# Patient Record
Sex: Female | Born: 1985 | Race: White | Hispanic: No | Marital: Married | Smoking: Never smoker
Health system: Southern US, Community
[De-identification: ages and names within clinical notes are randomized; demographics above are authoritative.]

## PROBLEM LIST (undated history)

## (undated) DIAGNOSIS — R87612 Low grade squamous intraepithelial lesion on cytologic smear of cervix (LGSIL): Secondary | ICD-10-CM

## (undated) DIAGNOSIS — Z803 Family history of malignant neoplasm of breast: Secondary | ICD-10-CM

## (undated) DIAGNOSIS — Z800 Family history of malignant neoplasm of digestive organs: Secondary | ICD-10-CM

## (undated) DIAGNOSIS — Z8742 Personal history of other diseases of the female genital tract: Secondary | ICD-10-CM

## (undated) DIAGNOSIS — Z9189 Other specified personal risk factors, not elsewhere classified: Secondary | ICD-10-CM

## (undated) DIAGNOSIS — Z8041 Family history of malignant neoplasm of ovary: Secondary | ICD-10-CM

## (undated) DIAGNOSIS — O4290 Premature rupture of membranes, unspecified as to length of time between rupture and onset of labor, unspecified weeks of gestation: Secondary | ICD-10-CM

## (undated) DIAGNOSIS — R8271 Bacteriuria: Secondary | ICD-10-CM

## (undated) DIAGNOSIS — Z1371 Encounter for nonprocreative screening for genetic disease carrier status: Secondary | ICD-10-CM

## (undated) HISTORY — DX: Family history of malignant neoplasm of breast: Z80.3

## (undated) HISTORY — DX: Personal history of other diseases of the female genital tract: Z87.42

## (undated) HISTORY — DX: Family history of malignant neoplasm of ovary: Z80.41

## (undated) HISTORY — DX: Family history of malignant neoplasm of digestive organs: Z80.0

## (undated) HISTORY — DX: Low grade squamous intraepithelial lesion on cytologic smear of cervix (LGSIL): R87.612

## (undated) HISTORY — DX: Other specified personal risk factors, not elsewhere classified: Z91.89

## (undated) HISTORY — DX: Encounter for nonprocreative screening for genetic disease carrier status: Z13.71

## (undated) HISTORY — PX: LEEP: SHX91

---
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
## 2017-08-11 ENCOUNTER — Emergency Department: Payer: No Typology Code available for payment source

## 2017-08-11 ENCOUNTER — Encounter: Payer: Self-pay | Admitting: Emergency Medicine

## 2017-08-11 ENCOUNTER — Emergency Department
Admission: EM | Admit: 2017-08-11 | Discharge: 2017-08-12 | Disposition: A | Discharge status: Home / Self Care | Payer: No Typology Code available for payment source | Attending: Emergency Medicine | Admitting: Emergency Medicine

## 2017-08-11 DIAGNOSIS — O26891 Other specified pregnancy related conditions, first trimester: Secondary | ICD-10-CM | POA: Insufficient documentation

## 2017-08-11 DIAGNOSIS — Z3A01 Less than 8 weeks gestation of pregnancy: Secondary | ICD-10-CM | POA: Diagnosis not present

## 2017-08-11 DIAGNOSIS — R300 Dysuria: Secondary | ICD-10-CM | POA: Diagnosis not present

## 2017-08-11 DIAGNOSIS — N898 Other specified noninflammatory disorders of vagina: Secondary | ICD-10-CM | POA: Insufficient documentation

## 2017-08-11 DIAGNOSIS — Z3491 Encounter for supervision of normal pregnancy, unspecified, first trimester: Secondary | ICD-10-CM

## 2017-08-11 LAB — URINALYSIS, COMPLETE (UACMP) WITH MICROSCOPIC
BILIRUBIN URINE: NEGATIVE
GLUCOSE, UA: NEGATIVE mg/dL
Ketones, ur: NEGATIVE mg/dL
Leukocytes, UA: NEGATIVE
NITRITE: NEGATIVE
PH: 6 (ref 5.0–8.0)
Protein, ur: NEGATIVE mg/dL
SPECIFIC GRAVITY, URINE: 1.008 (ref 1.005–1.030)

## 2017-08-11 LAB — COMPREHENSIVE METABOLIC PANEL
ALBUMIN: 4 g/dL (ref 3.5–5.0)
ALT: 21 U/L (ref 14–54)
ANION GAP: 8 (ref 5–15)
AST: 25 U/L (ref 15–41)
Alkaline Phosphatase: 74 U/L (ref 38–126)
BILIRUBIN TOTAL: 0.4 mg/dL (ref 0.3–1.2)
BUN: 11 mg/dL (ref 6–20)
CHLORIDE: 106 mmol/L (ref 101–111)
CO2: 24 mmol/L (ref 22–32)
Calcium: 9.2 mg/dL (ref 8.9–10.3)
Creatinine, Ser: 0.63 mg/dL (ref 0.44–1.00)
GFR calc Af Amer: >60 mL/min (ref >60)
GFR calc non Af Amer: >60 mL/min (ref >60)
Glucose, Bld: 96 mg/dL (ref 65–99)
POTASSIUM: 3.6 mmol/L (ref 3.5–5.1)
SODIUM: 138 mmol/L (ref 135–145)
TOTAL PROTEIN: 7.4 g/dL (ref 6.5–8.1)

## 2017-08-11 LAB — WET PREP, GENITAL
CLUE CELLS WET PREP: NONE SEEN
SPERM: NONE SEEN
Trich, Wet Prep: NONE SEEN
Yeast Wet Prep HPF POC: NONE SEEN

## 2017-08-11 LAB — CBC
HCT: 37.9 % (ref 35.0–47.0)
HEMOGLOBIN: 13 g/dL (ref 12.0–16.0)
MCH: 29.4 pg (ref 26.0–34.0)
MCHC: 34.4 g/dL (ref 32.0–36.0)
MCV: 85.5 fL (ref 80.0–100.0)
PLATELETS: 270 10*3/uL (ref 150–440)
RBC: 4.43 MIL/uL (ref 3.80–5.20)
RDW: 13.9 % (ref 11.5–14.5)
WBC: 9.7 10*3/uL (ref 3.6–11.0)

## 2017-08-11 LAB — HCG, QUANTITATIVE, PREGNANCY: HCG, BETA CHAIN, QUANT, S: 1890 m[IU]/mL — AB (ref <5)

## 2017-08-11 NOTE — ED Provider Notes (Signed)
[facility] Emergency Department Provider Note   First MD Initiated Contact with Patient 08/11/17 2315     (approximate)  I have reviewed the triage vital signs and the nursing notes.   HISTORY  Chief Complaint Abdominal Pain    HPI Rachel Montes is a 31 y.o. female G2 para 1 approximately 2-[redacted] weeks pregnant presents to the emergency department with abdominal discomfort dysuria. Patient denies any vaginal bleeding. Patient denies any hematuria no fever no back pain. Patient denies any nausea or vomiting. Patient denies any vaginal discharge.     Past Medical History  One previous vaginal delivery without consultation There are no active problems to display for this patient.   Past surgical history none Prior to Admission medications   Not on File    Allergies No known drug allergies No family history on file.  Social History Social History  Substance Use Topics  . Smoking status: Never Smoker  . Smokeless tobacco: Never Used  . Alcohol use Not on file    Review of Systems Constitutional: No fever/chills Eyes: No visual changes. ENT: No sore throat. Cardiovascular: Denies chest pain. Respiratory: Denies shortness of breath. Gastrointestinal: No abdominal pain.  No nausea, no vomiting.  No diarrhea.  No constipation. Genitourinary: Negative for dysuria. Musculoskeletal: Negative for neck pain.  Negative for back pain. Integumentary: Negative for rash. Neurological: Negative for headaches, focal weakness or numbness.  ____________________________________________   PHYSICAL EXAM:  VITAL SIGNS: ED Triage Vitals [08/11/17 2055]  Enc Vitals Group     BP 130/67     Pulse Rate 71     Resp 18     Temp 98.9 F (37.2 C)     Temp Source Oral     SpO2 99 %     Weight 65.8 kg (145 lb)     Height 1.727 m ( )     Head Circumference      Peak Flow      Pain Score 8     Pain Loc      Pain Edu?      Excl. in GC?      Constitutional: Alert and oriented. Well appearing and in no acute distress. Eyes: Conjunctivae are normal.  Head: Atraumatic. Mouth/Throat: Mucous membranes are moist.  Oropharynx non-erythematous. Neck: No stridor.  Cardiovascular: Normal rate, regular rhythm. Good peripheral circulation. Grossly normal heart sounds. Respiratory: Normal respiratory effort.  No retractions. Lungs CTAB. Gastrointestinal: Soft and nontender. No distention.  Genitourinary: scant white vaginal discharge. Musculoskeletal: No lower extremity tenderness nor edema. No gross deformities of extremities. Neurologic:  Normal speech and language. No gross focal neurologic deficits are appreciated.  Skin:  Skin is warm, dry and intact. No rash noted. Psychiatric: Mood and affect are normal. Speech and behavior are normal.  ____________________________________________   LABS (all labs ordered are listed, but only abnormal results are displayed)  Labs Reviewed  WET PREP, GENITAL - Abnormal; Notable for the following:       Result Value   WBC, Wet Prep HPF POC FEW (*)    All other components within normal limits  URINALYSIS, COMPLETE (UACMP) WITH MICROSCOPIC - Abnormal; Notable for the following:    Color, Urine STRAW (*)    APPearance CLEAR (*)    Hgb urine dipstick SMALL (*)    Bacteria, UA RARE (*)    Squamous Epithelial / LPF 0-5 (*)    All other components within normal limits  HCG, QUANTITATIVE, PREGNANCY - Abnormal; Notable for  the following:    hCG, Beta Chain, Quant, S 1,890 (*)    All other components within normal limits  CHLAMYDIA/NGC RT PCR ([facility] ONLY)  COMPREHENSIVE METABOLIC PANEL  CBC    RADIOLOGY I, [place] Manuelita Moxon, personally viewed and evaluated these images (plain radiographs) as part of my medical decision making, as well as reviewing the written report by the radiologist.  [place] Ob Comp Less 14 Wks  Result Date: 08/12/2017 CLINICAL DATA:  Initial evaluation for acute  pelvic pain for 2 days. Beta HCG equals 1,890. EXAM: OBSTETRIC <14 WK ULTRASOUND TECHNIQUE: Transabdominal ultrasound was performed for evaluation of the gestation as well as the maternal uterus and adnexal regions. COMPARISON:  None. FINDINGS: Intrauterine gestational sac: None visualized. Yolk sac:  None visualized. Embryo:  None visualized. Cardiac Activity: N/A Heart Rate: N/A bpm Subchorionic hemorrhage:  None visualized. Maternal uterus/adnexae: Right ovary visualized within the right adnexa and is normal in appearance. Left ovary was not visualized on this exam due to overlying bowel gas. No adnexal mass. No free fluid within the pelvis. IMPRESSION: 1. Early pregnancy with no IUP identified. This is consistent with a pregnancy of unknown location at this time. Differential considerations include pregnancy too early to visualize, recent SAB, or possibly occult ectopic pregnancy. Close clinical monitoring with serial beta hCGs and close interval follow-up ultrasound recommended as clinically warranted. 2. No other acute maternal uterine or adnexal abnormality. Electronically Signed   By: Rise MuBenjamin  McClintock M.D.   On: 08/12/2017 00:20   [place] Ob Transvaginal  Result Date: 08/12/2017 CLINICAL DATA:  Initial evaluation for acute pelvic pain for 2 days. Beta HCG equals 1,890. EXAM: OBSTETRIC <14 WK ULTRASOUND TECHNIQUE: Transabdominal ultrasound was performed for evaluation of the gestation as well as the maternal uterus and adnexal regions. COMPARISON:  None. FINDINGS: Intrauterine gestational sac: None visualized. Yolk sac:  None visualized. Embryo:  None visualized. Cardiac Activity: N/A Heart Rate: N/A bpm Subchorionic hemorrhage:  None visualized. Maternal uterus/adnexae: Right ovary visualized within the right adnexa and is normal in appearance. Left ovary was not visualized on this exam due to overlying bowel gas. No adnexal mass. No free fluid within the pelvis. IMPRESSION: 1. Early pregnancy with no IUP  identified. This is consistent with a pregnancy of unknown location at this time. Differential considerations include pregnancy too early to visualize, recent SAB, or possibly occult ectopic pregnancy. Close clinical monitoring with serial beta hCGs and close interval follow-up ultrasound recommended as clinically warranted. 2. No other acute maternal uterine or adnexal abnormality. Electronically Signed   By: Rise MuBenjamin  McClintock M.D.   On: 08/12/2017 00:20     Procedures   ____________________________________________   INITIAL IMPRESSION / ASSESSMENT AND PLAN / ED COURSE  Pertinent labs & imaging results that were available during my care of the patient were reviewed by me and considered in my medical decision making (see chart for details).  31 year old female presenting with above stated history. No clear etiology for the patient's dysuria noted considered possibly of a chemical urethritis. Regarding patient's pregnancy state hCG Quant 1890 with no visualized IUP noted most likely secondary to early gestation. However ectopic pregnancy cannot be excluded at this time. Patient is advised to follow-up with OB/GYN in 48 hours. Patient advised return to emergency department if any abdominal pain and vomiting vaginal bleeding or any other emergency medical concerns.      ____________________________________________  FINAL CLINICAL IMPRESSION(S) / ED DIAGNOSES  Final diagnoses:  First trimester pregnancy  Dysuria  MEDICATIONS GIVEN DURING THIS VISIT:  Medications - No data to display   NEW OUTPATIENT MEDICATIONS STARTED DURING THIS VISIT:  There are no discharge medications for this patient.   There are no discharge medications for this patient.   There are no discharge medications for this patient.    Note:  This document was prepared using Dragon voice recognition software and may include unintentional dictation errors.    Darci Current, MD 08/12/17 [phone]

## 2017-08-12 LAB — CHLAMYDIA/NGC RT PCR (ARMC ONLY)
CHLAMYDIA TR: NOT DETECTED
N GONORRHOEAE: NOT DETECTED

## 2017-09-05 DIAGNOSIS — R87612 Low grade squamous intraepithelial lesion on cytologic smear of cervix (LGSIL): Secondary | ICD-10-CM

## 2017-09-05 HISTORY — DX: Low grade squamous intraepithelial lesion on cytologic smear of cervix (LGSIL): R87.612

## 2017-09-14 ENCOUNTER — Encounter: Payer: Self-pay | Admitting: Maternal Newborn

## 2017-09-21 ENCOUNTER — Ambulatory Visit (INDEPENDENT_AMBULATORY_CARE_PROVIDER_SITE_OTHER): Payer: No Typology Code available for payment source | Admitting: Obstetrics and Gynecology

## 2017-09-21 ENCOUNTER — Encounter: Payer: Self-pay | Admitting: Obstetrics and Gynecology

## 2017-09-21 VITALS — BP 100/62 | Wt 155.0 lb

## 2017-09-21 DIAGNOSIS — Z3480 Encounter for supervision of other normal pregnancy, unspecified trimester: Secondary | ICD-10-CM

## 2017-09-21 DIAGNOSIS — Z1379 Encounter for other screening for genetic and chromosomal anomalies: Secondary | ICD-10-CM

## 2017-09-21 DIAGNOSIS — Z3A10 10 weeks gestation of pregnancy: Secondary | ICD-10-CM

## 2017-09-21 NOTE — Patient Instructions (Signed)
First Trimester of Pregnancy The first trimester of pregnancy is from week 1 until the end of week 13 (months 1 through 3). A week after a sperm fertilizes an egg, the egg will implant on the wall of the uterus. This embryo will begin to develop into a baby. Genes from you and your partner will form the baby. The female genes will determine whether the baby will be a boy or a girl. At 6-8 weeks, the eyes and face will be formed, and the heartbeat can be seen on ultrasound. At the end of 12 weeks, all the baby's organs will be formed. Now that you are pregnant, you will want to do everything you can to have a healthy baby. Two of the most important things are to get good prenatal care and to follow your health care provider's instructions. Prenatal care is all the medical care you receive before the baby's birth. This care will help prevent, find, and treat any problems during the pregnancy and childbirth. Body changes during your first trimester Your body goes through many changes during pregnancy. The changes vary from woman to woman.  You may gain or lose a couple of pounds at first.  You may feel sick to your stomach (nauseous) and you may throw up (vomit). If the vomiting is uncontrollable, call your health care provider.  You may tire easily.  You may develop headaches that can be relieved by medicines. All medicines should be approved by your health care provider.  You may urinate more often. Painful urination may mean you have a bladder infection.  You may develop heartburn as a result of your pregnancy.  You may develop constipation because certain hormones are causing the muscles that push stool through your intestines to slow down.  You may develop hemorrhoids or swollen veins (varicose veins).  Your breasts may begin to grow larger and become tender. Your nipples may stick out more, and the tissue that surrounds them (areola) may become darker.  Your gums may bleed and may be  sensitive to brushing and flossing.  Dark spots or blotches (chloasma, mask of pregnancy) may develop on your face. This will likely fade after the baby is born.  Your menstrual periods will stop.  You may have a loss of appetite.  You may develop cravings for certain kinds of food.  You may have changes in your emotions from day to day, such as being excited to be pregnant or being concerned that something may go wrong with the pregnancy and baby.  You may have more vivid and strange dreams.  You may have changes in your hair. These can include thickening of your hair, rapid growth, and changes in texture. Some women also have hair loss during or after pregnancy, or hair that feels dry or thin. Your hair will most likely return to normal after your baby is born.  What to expect at prenatal visits During a routine prenatal visit:  You will be weighed to make sure you and the baby are growing normally.  Your blood pressure will be taken.  Your abdomen will be measured to track your baby's growth.  The fetal heartbeat will be listened to between weeks 10 and 14 of your pregnancy.  Test results from any previous visits will be discussed.  Your health care provider may ask you:  How you are feeling.  If you are feeling the baby move.  If you have had any abnormal symptoms, such as leaking fluid, bleeding, severe headaches,   or abdominal cramping.  If you are using any tobacco products, including cigarettes, chewing tobacco, and electronic cigarettes.  If you have any questions.  Other tests that may be performed during your first trimester include:  Blood tests to find your blood type and to check for the presence of any previous infections. The tests will also be used to check for low iron levels (anemia) and protein on red blood cells (Rh antibodies). Depending on your risk factors, or if you previously had diabetes during pregnancy, you may have tests to check for high blood  sugar that affects pregnant women (gestational diabetes).  Urine tests to check for infections, diabetes, or protein in the urine.  An ultrasound to confirm the proper growth and development of the baby.  Fetal screens for spinal cord problems (spina bifida) and Down syndrome.  HIV (human immunodeficiency virus) testing. Routine prenatal testing includes screening for HIV, unless you choose not to have this test.  You may need other tests to make sure you and the baby are doing well.  Follow these instructions at home: Medicines  Follow your health care provider's instructions regarding medicine use. Specific medicines may be either safe or unsafe to take during pregnancy.  Take a prenatal vitamin that contains at least 600 micrograms (mcg) of folic acid.  If you develop constipation, try taking a stool softener if your health care provider approves. Eating and drinking  Eat a balanced diet that includes fresh fruits and vegetables, whole grains, good sources of protein such as meat, eggs, or tofu, and low-fat dairy. Your health care provider will help you determine the amount of weight gain that is right for you.  Avoid raw meat and uncooked cheese. These carry germs that can cause birth defects in the baby.  Eating four or five small meals rather than three large meals a day may help relieve nausea and vomiting. If you start to feel nauseous, eating a few soda crackers can be helpful. Drinking liquids between meals, instead of during meals, also seems to help ease nausea and vomiting.  Limit foods that are high in fat and processed sugars, such as fried and sweet foods.  To prevent constipation: ? Eat foods that are high in fiber, such as fresh fruits and vegetables, whole grains, and beans. ? Drink enough fluid to keep your urine clear or pale yellow. Activity  Exercise only as directed by your health care provider. Most women can continue their usual exercise routine during  pregnancy. Try to exercise for 30 minutes at least 5 days a week. Exercising will help you: ? Control your weight. ? Stay in shape. ? Be prepared for labor and delivery.  Experiencing pain or cramping in the lower abdomen or lower back is a good sign that you should stop exercising. Check with your health care provider before continuing with normal exercises.  Try to avoid standing for long periods of time. Move your legs often if you must stand in one place for a long time.  Avoid heavy lifting.  Wear low-heeled shoes and practice good posture.  You may continue to have sex unless your health care provider tells you not to. Relieving pain and discomfort  Wear a good support bra to relieve breast tenderness.  Take warm sitz baths to soothe any pain or discomfort caused by hemorrhoids. Use hemorrhoid cream if your health care provider approves.  Rest with your legs elevated if you have leg cramps or low back pain.  If you develop   varicose veins in your legs, wear support hose. Elevate your feet for 15 minutes, 3-4 times a day. Limit salt in your diet. Prenatal care  Schedule your prenatal visits by the twelfth week of pregnancy. They are usually scheduled monthly at first, then more often in the last 2 months before delivery.  Write down your questions. Take them to your prenatal visits.  Keep all your prenatal visits as told by your health care provider. This is important. Safety  Wear your seat belt at all times when driving.  Make a list of emergency phone numbers, including numbers for family, friends, the hospital, and police and fire departments. General instructions  Ask your health care provider for a referral to a local prenatal education class. Begin classes no later than the beginning of month 6 of your pregnancy.  Ask for help if you have counseling or nutritional needs during pregnancy. Your health care provider can offer advice or refer you to specialists for help  with various needs.  Do not use hot tubs, steam rooms, or saunas.  Do not douche or use tampons or scented sanitary pads.  Do not cross your legs for long periods of time.  Avoid cat litter boxes and soil used by cats. These carry germs that can cause birth defects in the baby and possibly loss of the fetus by miscarriage or stillbirth.  Avoid all smoking, herbs, alcohol, and medicines not prescribed by your health care provider. Chemicals in these products affect the formation and growth of the baby.  Do not use any products that contain nicotine or tobacco, such as cigarettes and e-cigarettes. If you need help quitting, ask your health care provider. You may receive counseling support and other resources to help you quit.  Schedule a dentist appointment. At home, brush your teeth with a soft toothbrush and be gentle when you floss. Contact a health care provider if:  You have dizziness.  You have mild pelvic cramps, pelvic pressure, or nagging pain in the abdominal area.  You have persistent nausea, vomiting, or diarrhea.  You have a bad smelling vaginal discharge.  You have pain when you urinate.  You notice increased swelling in your face, hands, legs, or ankles.  You are exposed to fifth disease or chickenpox.  You are exposed to German measles (rubella) and have never had it. Get help right away if:  You have a fever.  You are leaking fluid from your vagina.  You have spotting or bleeding from your vagina.  You have severe abdominal cramping or pain.  You have rapid weight gain or loss.  You vomit blood or material that looks like coffee grounds.  You develop a severe headache.  You have shortness of breath.  You have any kind of trauma, such as from a fall or a car accident. Summary  The first trimester of pregnancy is from week 1 until the end of week 13 (months 1 through 3).  Your body goes through many changes during pregnancy. The changes vary from  woman to woman.  You will have routine prenatal visits. During those visits, your health care provider will examine you, discuss any test results you may have, and talk with you about how you are feeling. This information is not intended to replace advice given to you by your health care provider. Make sure you discuss any questions you have with your health care provider. Document Released: 11/16/2001 Document Revised: 11/03/2016 Document Reviewed: 11/03/2016 Elsevier Interactive Patient Education  2017 Elsevier   Inc.  
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
## 2017-09-23 LAB — RPR+RH+ABO+RUB AB+AB SCR+CB...
Antibody Screen: NEGATIVE
HEMATOCRIT: 34.7 % (ref 34.0–46.6)
HEMOGLOBIN: 11.8 g/dL (ref 11.1–15.9)
HEP B S AG: NEGATIVE
HIV SCREEN 4TH GENERATION: NONREACTIVE
MCH: 29.3 pg (ref 26.6–33.0)
MCHC: 34 g/dL (ref 31.5–35.7)
MCV: 86 fL (ref 79–97)
Platelets: 289 10*3/uL (ref 150–379)
RBC: 4.03 x10E6/uL (ref 3.77–5.28)
RDW: 14.1 % (ref 12.3–15.4)
RH TYPE: NEGATIVE
RPR: NONREACTIVE
Rubella Antibodies, IGG: 0.94 index — ABNORMAL LOW (ref >0.99)
Varicella zoster IgG: 344 index (ref >165)
WBC: 9.2 10*3/uL (ref 3.4–10.8)

## 2017-09-23 LAB — URINE CULTURE

## 2017-09-24 LAB — PAPIG, HPV, RFX 16/18
HPV, high-risk: POSITIVE — AB
PAP SMEAR COMMENT: 0

## 2017-09-27 ENCOUNTER — Encounter: Payer: Self-pay | Admitting: Obstetrics and Gynecology

## 2017-09-28 ENCOUNTER — Telehealth: Payer: Self-pay | Admitting: Obstetrics and Gynecology

## 2017-09-28 NOTE — Telephone Encounter (Signed)
Pt was seen 09/1717 with [facility]> pt was to return in a week for an NT scan and ob appt. Unable to leave voicemail for pt to call back to be schedule

## 2017-09-28 NOTE — Telephone Encounter (Signed)
Pt is  possibility 12w 5 days 09/28/17.

## 2017-10-01 ENCOUNTER — Other Ambulatory Visit: Payer: Self-pay | Admitting: Obstetrics and Gynecology

## 2017-10-01 ENCOUNTER — Encounter: Payer: Self-pay | Admitting: Obstetrics and Gynecology

## 2017-10-01 DIAGNOSIS — Z3400 Encounter for supervision of normal first pregnancy, unspecified trimester: Secondary | ICD-10-CM | POA: Insufficient documentation

## 2017-10-01 DIAGNOSIS — R87612 Low grade squamous intraepithelial lesion on cytologic smear of cervix (LGSIL): Secondary | ICD-10-CM | POA: Insufficient documentation

## 2017-10-01 DIAGNOSIS — R8271 Bacteriuria: Secondary | ICD-10-CM

## 2017-10-01 HISTORY — DX: Bacteriuria: R82.71

## 2017-10-01 MED ORDER — CEPHALEXIN 500 MG PO CAPS: 500.0000 mg | ORAL_CAPSULE | Freq: Three times a day (TID) | ORAL | 0 refills | Status: AC

## 2017-10-07 NOTE — Telephone Encounter (Signed)
Called and lvm voicemail for pt to call back to be schedule.

## 2017-10-10 ENCOUNTER — Other Ambulatory Visit: Payer: Self-pay | Admitting: Obstetrics and Gynecology

## 2017-10-10 MED ORDER — TERCONAZOLE 0.4 % VA CREA: 1.0000 | TOPICAL_CREAM | Freq: Every day | VAGINAL | 1 refills | Status: DC

## 2017-10-25 ENCOUNTER — Encounter: Payer: Self-pay | Admitting: Obstetrics and Gynecology

## 2017-11-09 ENCOUNTER — Encounter: Payer: Self-pay | Admitting: Advanced Practice Midwife

## 2017-11-09 ENCOUNTER — Ambulatory Visit (INDEPENDENT_AMBULATORY_CARE_PROVIDER_SITE_OTHER): Payer: No Typology Code available for payment source | Admitting: Advanced Practice Midwife

## 2017-11-09 VITALS — BP 102/62 | Wt 157.0 lb

## 2017-11-09 DIAGNOSIS — Z3A17 17 weeks gestation of pregnancy: Secondary | ICD-10-CM

## 2017-11-09 NOTE — Progress Notes (Signed)
Routine Prenatal Care Visit  Subjective  Rachel Montes is a 31 y.o. G3P1011 at 482w4d being seen today for ongoing prenatal care.  She is currently monitored for the following issues for this low-risk pregnancy and has Supervision of normal first pregnancy, antepartum; Low grade squamous intraepith lesion on cytologic smear cervix (lgsil); and GBS bacteriuria on their problem list.  ----------------------------------------------------------------------------------- Patient reports no complaints.  Patient and her husband are under the impression that today they will find out the gender of the baby per conversation they had with Dr Bonney AidStaebler. Informed patient that Dr Bonney AidStaebler may have been talking about bedside u/s, but he is not in office today and I am not qualified to provide that service and otherwise that is usually done at 20 week anatomy scan. Patient and husband seem disappointed and that they are in a hurry to find out the gender. I have told them that at 18 weeks we can do the anatomy scan and since she will be 4270w6d on Friday that they could have it then if scheduling allows. Discussion of the need for colposcopy that she will need to schedule. Also brief discussion of husband's latent TB and the possibility of interferon gold blood test.  Denies contractions. Denies vaginal bleeding. Denies leaking of fluid.  ----------------------------------------------------------------------------------- The following portions of the patient's history were reviewed and updated as appropriate: allergies, current medications, past family history, past medical history, past social history, past surgical history and problem list. Problem list updated.   Objective  Blood pressure 102/62, weight 157 lb (71.2 kg), last menstrual period 07/01/2017. Pregravid weight 140 lb (63.5 kg) Total Weight Gain 17 lb (7.711 kg) Urinalysis: Urine Protein: Negative Urine Glucose: Negative  Fetal Status: positive fetal  movement  General:  Alert, oriented and cooperative. Patient is in no acute distress.  Skin: Skin is warm and dry. No rash noted.   Cardiovascular: Normal heart rate noted  Respiratory: Normal respiratory effort, no problems with respiration noted  Abdomen: Soft, gravid, appropriate for gestational age.       Pelvic:  Cervical exam deferred        Extremities: Normal range of motion.     Mental Status: Normal mood and affect. Normal behavior. Normal judgment and thought content.   Assessment   31 y.o. G3P1011 at 552w4d by  04/15/2018, by Ultrasound presenting for routine prenatal visit  Plan   Pregnancy#2 Problems (from 07/01/17 to present)    Problem Noted Resolved   Supervision of normal first pregnancy, antepartum 10/01/2017 by Vena AustriaStaebler, Andreas, MD No   Overview Signed 10/01/2017  9:29 AM by Vena AustriaStaebler, Andreas, MD    Clinic [facility] Prenatal Labs  [facility] Blood type: O negative  Genetic Screen 1 Screen:    AFP:     Quad:     NIPS: Antibody: negative  Anatomic US  Rubella: Non-Immune Varicella: Immune  GTT Third trimester:  RPR: NR  Rhogam [ ]  28 weeks HBsAg: negative  TDaP vaccine                       Flu Shot: HIV: negative  Baby Food                                GBS: Bacteruria   Contraception  Pap: 09/23/17 LSIL HPV positive  CBB     CS/VBAC    Support Person Arlys JohnBrian  Low grade squamous intraepith lesion on cytologic smear cervix (lgsil) 10/01/2017 by Vena AustriaStaebler, Andreas, MD No   Overview Signed 10/01/2017  9:29 AM by Vena AustriaStaebler, Andreas, MD    [ ]  Colposcopy outside of first trimester      GBS bacteriuria 10/01/2017 by Vena AustriaStaebler, Andreas, MD No       Preterm labor symptoms and general obstetric precautions including but not limited to vaginal bleeding, contractions, leaking of fluid and fetal movement were reviewed in detail with the patient.   Return in about 2 days (around 11/11/2017) for anatomy scan 2 days rob in 4 weeks.  Tresea MallJane Merrill Deanda,  CNM  11/09/2017 4:47 PM

## 2017-11-09 NOTE — Progress Notes (Signed)
Pt reports no problems.   

## 2017-11-10 ENCOUNTER — Ambulatory Visit (INDEPENDENT_AMBULATORY_CARE_PROVIDER_SITE_OTHER): Payer: No Typology Code available for payment source

## 2017-11-10 ENCOUNTER — Encounter: Payer: Self-pay | Admitting: Obstetrics and Gynecology

## 2017-11-10 ENCOUNTER — Other Ambulatory Visit: Payer: Self-pay | Admitting: Obstetrics and Gynecology

## 2017-11-10 ENCOUNTER — Ambulatory Visit (INDEPENDENT_AMBULATORY_CARE_PROVIDER_SITE_OTHER): Payer: No Typology Code available for payment source | Admitting: Obstetrics and Gynecology

## 2017-11-10 VITALS — BP 110/64 | Wt 158.0 lb

## 2017-11-10 DIAGNOSIS — Z3A10 10 weeks gestation of pregnancy: Secondary | ICD-10-CM | POA: Diagnosis not present

## 2017-11-10 DIAGNOSIS — Z1379 Encounter for other screening for genetic and chromosomal anomalies: Secondary | ICD-10-CM | POA: Diagnosis not present

## 2017-11-10 DIAGNOSIS — Z3480 Encounter for supervision of other normal pregnancy, unspecified trimester: Secondary | ICD-10-CM

## 2017-11-10 DIAGNOSIS — Z3400 Encounter for supervision of normal first pregnancy, unspecified trimester: Secondary | ICD-10-CM

## 2017-11-10 DIAGNOSIS — R8271 Bacteriuria: Secondary | ICD-10-CM

## 2017-11-10 DIAGNOSIS — Z3A17 17 weeks gestation of pregnancy: Secondary | ICD-10-CM

## 2017-11-10 NOTE — Progress Notes (Signed)
  Routine Prenatal Care Visit  Subjective  Rachel Montes is a 31 y.o. G3P1011 at 4545w5d being seen today for ongoing prenatal care.  She is currently monitored for the following issues for this low-risk pregnancy and has Supervision of normal first pregnancy, antepartum; Low grade squamous intraepith lesion on cytologic smear cervix (lgsil); and GBS bacteriuria on their problem list.  ----------------------------------------------------------------------------------- Patient reports no complaints.    . Vag. Bleeding: None.  Movement: Present. Denies leaking of fluid.  Anatomy screen incomplete today.  Try to complete at next visit. Discussed need for colposcopy.  ----------------------------------------------------------------------------------- The following portions of the patient's history were reviewed and updated as appropriate: allergies, current medications, past family history, past medical history, past social history, past surgical history and problem list. Problem list updated.  Objective  Blood pressure 110/64, weight 158 lb (71.7 kg), last menstrual period 07/01/2017. Pregravid weight 140 lb (63.5 kg) Total Weight Gain 18 lb (8.165 kg) Urinalysis: Urine Protein: Negative Urine Glucose: Negative  Fetal Status: Fetal Heart Rate (bpm): Present   Movement: Present     General:  Alert, oriented and cooperative. Patient is in no acute distress.  Skin: Skin is warm and dry. No rash noted.   Cardiovascular: Normal heart rate noted  Respiratory: Normal respiratory effort, no problems with respiration noted  Abdomen: Soft, gravid, appropriate for gestational age.       Pelvic:  Cervical exam deferred        Extremities: Normal range of motion.     Mental Status: Normal mood and affect. Normal behavior. Normal judgment and thought content.   Assessment   31 y.o. G3P1011 at 5145w5d by  04/15/2018, by Ultrasound presenting for routine prenatal visit  Plan   Pregnancy#2 Problems (from  07/01/17 to present)    Problem Noted Resolved   Supervision of normal first pregnancy, antepartum 10/01/2017 by Vena AustriaStaebler, Andreas, MD No   Overview Signed 10/01/2017  9:29 AM by Vena AustriaStaebler, Andreas, MD    [facility]inic [facility] Prenatal Labs  [facility]d [facility] Blood type: O negative  Genetic Screen 1 Screen:    AFP:     Quad:     NIPS: Antibody: negative  Anatomic US  Rubella: Non-Immune Varicella: Immune  GTT Third trimester:  RPR: NR  Rhogam [ ]  28 weeks HBsAg: negative  TDaP vaccine                       Flu Shot: HIV: negative  Baby Food                                GBS: Bacteruria   Contraception  Pap: 09/23/17 LSIL HPV positive  CBB     CS/VBAC    Support Person         Low grade squamous intraepith lesion on cytologic smear cervix (lgsil) 10/01/2017 by Vena AustriaStaebler, Andreas, MD No   Overview Signed 10/01/2017  9:29 AM by Vena AustriaStaebler, Andreas, MD    [ ]  Colposcopy outside of first trimester      GBS bacteriuria 10/01/2017 by Vena AustriaStaebler, Andreas, MD No    Please refer to After Visit Summary for other counseling recommendations.   Return in about 4 weeks (around 12/08/2017) for add f/u u/s to appt with AMS in January, add colposcopy when possible.  Thomasene MohairStephen Mizraim Harmening, MD  11/10/2017 11:52 AM

## 2017-11-16 ENCOUNTER — Telehealth: Payer: Self-pay | Admitting: Obstetrics and Gynecology

## 2017-11-16 NOTE — Telephone Encounter (Signed)
Any advice? If not complete note

## 2017-11-16 NOTE — Telephone Encounter (Signed)
{  Pt is calling to report to her Provider that she saw her primary care provider  and has tested positive for Flu.

## 2017-11-17 ENCOUNTER — Other Ambulatory Visit: Payer: Self-pay | Admitting: Advanced Practice Midwife

## 2017-11-17 DIAGNOSIS — J111 Influenza due to unidentified influenza virus with other respiratory manifestations: Secondary | ICD-10-CM

## 2017-11-17 MED ORDER — OSELTAMIVIR PHOSPHATE 75 MG PO CAPS: 75.0000 mg | ORAL_CAPSULE | Freq: Two times a day (BID) | ORAL | 0 refills | Status: AC

## 2017-11-17 NOTE — Telephone Encounter (Signed)
Pt states they did not give her anything for the flu. Her pharmacy is [facility] in [place]. Per AMS please call this in for her.  Pt aware to call her pharmacy around lunch. Thanks Selena BattenKim

## 2017-11-17 NOTE — Telephone Encounter (Signed)
She should receive tamiflu, verify she was started on this otherwise we'll need to call it in.  I'm in the OR and don't have a midwife and still have to do rounds so if someone can send that in if her PCP didn't start her already

## 2017-11-17 NOTE — Telephone Encounter (Signed)
I have sent the tamiflu to the pharmacy.

## 2017-12-06 HISTORY — PX: COLPOSCOPY: SHX161

## 2017-12-06 NOTE — L&D Delivery Note (Addendum)
Delivery Note Primary OB: [facility] Delivery Provider: Marcelyn Bruins, CNM Gestational Age: Full term Antepartum complications: none Intrapartum complications: None  A viable Female was delivered via vertex presentation.  No nuchal cord was present. There was a shoulder dystocia. Delivery of the anterior shoulder was accomplished with McRoberts and suprapubic pressure. The posterior shoulder and body followed without difficulty. The infant was placed on the maternal abdomen. Cord blood was collected. The umbilical cord was doubly clamped and cut following delayed cord clamping. The placenta was delivered spontaneously and was inspected and found to be intact with a three vessel cord. The cervix and vagina were inspected. A first degree perineal laceration was repaired with 3-0 Vicryl. The fundus was firm. The newborn was examined and there was no evidence of injury. Patient and infant were bonding in stable condition. All counts were correct.  Apgars:8, 9  Weight:  8 lb.,10 oz.   Placenta status: spontaneous and Intact.  Cord: 3 vessels; with the following complications: none.  Anesthesia:  epidural Episiotomy:  none Lacerations:  1st degree Suture Repair: 3.0 vicryl Est. Blood Loss (mL):  950 mL  Mom to postpartum.  Baby to Couplet care / Skin to Skin.  Marcelyn Bruins, CNM [facility], [facility] 04/10/2018  11:17 PM

## 2017-12-07 ENCOUNTER — Encounter: Payer: No Typology Code available for payment source | Admitting: Obstetrics and Gynecology

## 2017-12-08 ENCOUNTER — Encounter: Payer: No Typology Code available for payment source | Admitting: Obstetrics and Gynecology

## 2017-12-08 ENCOUNTER — Other Ambulatory Visit: Payer: No Typology Code available for payment source

## 2017-12-12 ENCOUNTER — Ambulatory Visit (INDEPENDENT_AMBULATORY_CARE_PROVIDER_SITE_OTHER): Payer: Medicaid Other | Admitting: Obstetrics and Gynecology

## 2017-12-12 ENCOUNTER — Encounter: Payer: Self-pay | Admitting: Obstetrics and Gynecology

## 2017-12-12 VITALS — BP 114/70 | Wt 162.0 lb

## 2017-12-12 DIAGNOSIS — R87612 Low grade squamous intraepithelial lesion on cytologic smear of cervix (LGSIL): Secondary | ICD-10-CM | POA: Diagnosis not present

## 2017-12-12 NOTE — Progress Notes (Signed)
HPI:  Rachel Montes is a 32 y.o.  G3P1011  who presents today for evaluation and management of abnormal cervical cytology.    Dysplasia History:  LGSIL, HPV +   OB History  Gravida Para Term Preterm AB Living  3 1 1   1 1   SAB TAB Ectopic Multiple Live Births          1    # Outcome Date GA Lbr Len/2nd Weight Sex Delivery Anes PTL Lv  3 Current           2 Term 01/06/14   7 lb 9 oz (3.43 kg) F Vag-Spont  N LIV  1 AB               Past Medical History:  Diagnosis Date  . Family history of ovarian cancer    11/18 genetic testing letter sent    History reviewed. No pertinent surgical history.  SOCIAL HISTORY:  Social History   Substance and Sexual Activity  Alcohol Use Yes    Social History   Substance and Sexual Activity  Drug Use No     Family History  Problem Relation Age of Onset  . Breast cancer Mother [id number]  . Ovarian cancer Paternal Grandmother   . Ovarian cancer Paternal Aunt     ALLERGIES:  Patient has no known allergies.  Current Outpatient Medications on File Prior to Visit  Medication Sig Dispense Refill  . Prenatal Vit-Fe Fumarate-FA (MULTIVITAMIN-PRENATAL) 27-0.8 MG TABS tablet Take 1 tablet by mouth daily at 12 noon.     No current facility-administered medications on file prior to visit.     Physical Exam: -Vitals:  BP 114/70   Wt 162 lb (73.5 kg)   LMP 07/01/2017   BMI 24.63 kg/m  GEN: WD, WN, NAD.  A+ O x 3, good mood and affect. ABD:  NT, ND.  Soft, no masses.  No hernias noted.   Pelvic:   Vulva: Normal appearance.  No lesions.  Vagina: No lesions or abnormalities noted.  Support: Normal pelvic support.  Urethra No masses tenderness or scarring.  Meatus Normal size without lesions or prolapse.  Cervix: See below.  Anus: Normal exam.  No lesions.  Perineum: Normal exam.  No lesions.        Bimanual   Uterus: Normal size.  Non-tender.  Mobile.  AV.  Adnexae: No masses.  Non-tender to palpation.  Cul-de-sac: Negative for  abnormality.   PROCEDURE: 1.  Urine Pregnancy Test:  not done (patient is [redacted] weeks pregnant) 2.  Colposcopy performed with 4% acetic acid after verbal consent obtained                                         -Aceto-white Lesions Location(s): mildly at 4-6 and 10-12 o'clock.              -Biopsy performed: no              -ECC indicated and performed: No.   -Satisfactory colposcopy: Yes.      -Evidence of Invasive cervical CA :  NO  ASSESSMENT:  Rachel Montes is a 32 y.o. G3P1011 here for  1. LGSIL on Pap smear of cervix   .  PLAN: 1.  I discussed the grading system of pap smears and HPV high risk viral types.  We will discuss and base management after colpo results return.  2. Follow up PAP postpartum      Thomasene Mohair, MD  [facility], [facility] 12/12/2017  6:09 PM

## 2017-12-21 ENCOUNTER — Encounter: Payer: Self-pay | Admitting: Obstetrics and Gynecology

## 2017-12-27 ENCOUNTER — Encounter: Payer: Self-pay | Admitting: Obstetrics and Gynecology

## 2017-12-27 ENCOUNTER — Ambulatory Visit (INDEPENDENT_AMBULATORY_CARE_PROVIDER_SITE_OTHER): Payer: Medicaid Other | Admitting: Obstetrics and Gynecology

## 2017-12-27 ENCOUNTER — Ambulatory Visit (INDEPENDENT_AMBULATORY_CARE_PROVIDER_SITE_OTHER): Payer: Medicaid Other

## 2017-12-27 VITALS — BP 114/70 | Wt 164.0 lb

## 2017-12-27 DIAGNOSIS — Z3400 Encounter for supervision of normal first pregnancy, unspecified trimester: Secondary | ICD-10-CM

## 2017-12-27 DIAGNOSIS — Z362 Encounter for other antenatal screening follow-up: Secondary | ICD-10-CM | POA: Diagnosis not present

## 2017-12-27 DIAGNOSIS — Z131 Encounter for screening for diabetes mellitus: Secondary | ICD-10-CM

## 2017-12-27 DIAGNOSIS — O26899 Other specified pregnancy related conditions, unspecified trimester: Secondary | ICD-10-CM

## 2017-12-27 DIAGNOSIS — Z3A24 24 weeks gestation of pregnancy: Secondary | ICD-10-CM

## 2017-12-27 DIAGNOSIS — Z113 Encounter for screening for infections with a predominantly sexual mode of transmission: Secondary | ICD-10-CM

## 2017-12-27 DIAGNOSIS — Z6791 Unspecified blood type, Rh negative: Secondary | ICD-10-CM

## 2017-12-27 DIAGNOSIS — R87612 Low grade squamous intraepithelial lesion on cytologic smear of cervix (LGSIL): Secondary | ICD-10-CM

## 2017-12-27 DIAGNOSIS — O09899 Supervision of other high risk pregnancies, unspecified trimester: Secondary | ICD-10-CM

## 2017-12-27 DIAGNOSIS — R8271 Bacteriuria: Secondary | ICD-10-CM
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
## 2018-01-24 ENCOUNTER — Other Ambulatory Visit: Payer: Medicaid Other

## 2018-01-24 ENCOUNTER — Ambulatory Visit (INDEPENDENT_AMBULATORY_CARE_PROVIDER_SITE_OTHER): Payer: Medicaid Other | Admitting: Obstetrics and Gynecology

## 2018-01-24 ENCOUNTER — Encounter: Payer: Self-pay | Admitting: Obstetrics and Gynecology

## 2018-01-24 VITALS — BP 118/74 | Wt 170.0 lb

## 2018-01-24 DIAGNOSIS — Z131 Encounter for screening for diabetes mellitus: Secondary | ICD-10-CM

## 2018-01-24 DIAGNOSIS — O09899 Supervision of other high risk pregnancies, unspecified trimester: Secondary | ICD-10-CM | POA: Diagnosis not present

## 2018-01-24 DIAGNOSIS — Z3400 Encounter for supervision of normal first pregnancy, unspecified trimester: Secondary | ICD-10-CM | POA: Diagnosis not present

## 2018-01-24 DIAGNOSIS — Z3A28 28 weeks gestation of pregnancy: Secondary | ICD-10-CM

## 2018-01-24 DIAGNOSIS — O26899 Other specified pregnancy related conditions, unspecified trimester: Secondary | ICD-10-CM

## 2018-01-24 DIAGNOSIS — Z6791 Unspecified blood type, Rh negative: Secondary | ICD-10-CM | POA: Diagnosis not present

## 2018-01-24 DIAGNOSIS — R8271 Bacteriuria: Secondary | ICD-10-CM

## 2018-01-24 DIAGNOSIS — Z113 Encounter for screening for infections with a predominantly sexual mode of transmission: Secondary | ICD-10-CM

## 2018-01-24 MED ORDER — RHO D IMMUNE GLOBULIN 1500 UNIT/2ML IJ SOSY
300.0000 ug | PREFILLED_SYRINGE | Freq: Once | INTRAMUSCULAR | Status: AC
  Administered 2018-01-24: 300 ug via INTRAMUSCULAR
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
## 2018-01-25 LAB — 28 WEEKS RH-PANEL
Antibody Screen: NEGATIVE
BASOS: 0 %
Basophils Absolute: 0 10*3/uL (ref 0.0–0.2)
EOS (ABSOLUTE): 0.2 10*3/uL (ref 0.0–0.4)
EOS: 2 %
Gestational Diabetes Screen: 104 mg/dL (ref 65–139)
HIV SCREEN 4TH GENERATION: NONREACTIVE
Hematocrit: 35.3 % (ref 34.0–46.6)
Hemoglobin: 11.5 g/dL (ref 11.1–15.9)
IMMATURE GRANS (ABS): 0.1 10*3/uL (ref 0.0–0.1)
IMMATURE GRANULOCYTES: 1 %
LYMPHS: 15 %
Lymphocytes Absolute: 1.4 10*3/uL (ref 0.7–3.1)
MCH: 29.6 pg (ref 26.6–33.0)
MCHC: 32.6 g/dL (ref 31.5–35.7)
MCV: 91 fL (ref 79–97)
MONOS ABS: 0.7 10*3/uL (ref 0.1–0.9)
Monocytes: 7 %
NEUTROS ABS: 7.3 10*3/uL — AB (ref 1.4–7.0)
NEUTROS PCT: 75 %
Platelets: 324 10*3/uL (ref 150–379)
RBC: 3.88 x10E6/uL (ref 3.77–5.28)
RDW: 13.5 % (ref 12.3–15.4)
RPR Ser Ql: NONREACTIVE
WBC: 9.7 10*3/uL (ref 3.4–10.8)

## 2018-01-27 ENCOUNTER — Encounter: Payer: Self-pay | Admitting: Obstetrics and Gynecology

## 2018-02-07 ENCOUNTER — Ambulatory Visit (INDEPENDENT_AMBULATORY_CARE_PROVIDER_SITE_OTHER): Payer: Medicaid Other | Admitting: Obstetrics and Gynecology

## 2018-02-07 ENCOUNTER — Telehealth: Payer: Self-pay

## 2018-02-07 VITALS — BP 102/56 | Wt 173.0 lb

## 2018-02-07 DIAGNOSIS — R87612 Low grade squamous intraepithelial lesion on cytologic smear of cervix (LGSIL): Secondary | ICD-10-CM | POA: Diagnosis not present

## 2018-02-07 DIAGNOSIS — Z23 Encounter for immunization: Secondary | ICD-10-CM | POA: Diagnosis not present

## 2018-02-07 DIAGNOSIS — O09899 Supervision of other high risk pregnancies, unspecified trimester: Secondary | ICD-10-CM

## 2018-02-07 DIAGNOSIS — Z3A30 30 weeks gestation of pregnancy: Secondary | ICD-10-CM | POA: Diagnosis not present

## 2018-02-07 DIAGNOSIS — Z3400 Encounter for supervision of normal first pregnancy, unspecified trimester: Secondary | ICD-10-CM

## 2018-02-07 DIAGNOSIS — O26899 Other specified pregnancy related conditions, unspecified trimester: Secondary | ICD-10-CM

## 2018-02-07 DIAGNOSIS — Z6791 Unspecified blood type, Rh negative: Secondary | ICD-10-CM

## 2018-02-07 DIAGNOSIS — R8271 Bacteriuria: Secondary | ICD-10-CM

## 2018-02-07 NOTE — Progress Notes (Signed)
ROB °TDAP today ° °

## 2018-02-07 NOTE — Telephone Encounter (Signed)
Pt left OB appt today and was told everything was normal and going well. She went home and had intercourse with her husband and now c/o bright red bleeding like a period with clots and mucousy discharge. Please advise. Pt is very anxious and concerned. Thank you.

## 2018-02-07 NOTE — Telephone Encounter (Signed)
Reported some spotting post coitus.  No cramping or abdominal pain.  Normotensive at todays visit.  No recent trauma.  Good fetal movement.  Discussed if concerned or heavier can be evluated further on L&D otherwise if continued good movement, light bleeding that subsides, and no abdominal pain reasonable to monitor
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
## 2018-02-22 ENCOUNTER — Ambulatory Visit (INDEPENDENT_AMBULATORY_CARE_PROVIDER_SITE_OTHER): Payer: Medicaid Other | Admitting: Obstetrics and Gynecology

## 2018-02-22 VITALS — BP 110/56 | Wt 172.0 lb

## 2018-02-22 DIAGNOSIS — Z3400 Encounter for supervision of normal first pregnancy, unspecified trimester: Secondary | ICD-10-CM

## 2018-02-22 DIAGNOSIS — O26899 Other specified pregnancy related conditions, unspecified trimester: Secondary | ICD-10-CM

## 2018-02-22 DIAGNOSIS — R8271 Bacteriuria: Secondary | ICD-10-CM

## 2018-02-22 DIAGNOSIS — O09899 Supervision of other high risk pregnancies, unspecified trimester: Secondary | ICD-10-CM

## 2018-02-22 DIAGNOSIS — Z3A32 32 weeks gestation of pregnancy: Secondary | ICD-10-CM

## 2018-02-22 DIAGNOSIS — Z6791 Unspecified blood type, Rh negative: Secondary | ICD-10-CM

## 2018-02-22 NOTE — Progress Notes (Signed)
Pt reports no problems.   
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
## 2018-03-08 ENCOUNTER — Ambulatory Visit (INDEPENDENT_AMBULATORY_CARE_PROVIDER_SITE_OTHER): Payer: Medicaid Other | Admitting: Advanced Practice Midwife

## 2018-03-08 ENCOUNTER — Encounter: Payer: Self-pay | Admitting: Advanced Practice Midwife

## 2018-03-08 VITALS — BP 118/74 | Wt 176.0 lb

## 2018-03-08 DIAGNOSIS — Z3A34 34 weeks gestation of pregnancy: Secondary | ICD-10-CM
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
## 2018-03-08 NOTE — Progress Notes (Signed)
No vb. No lof. Some cramping and lower back pain Sunday and monday

## 2018-03-22 ENCOUNTER — Encounter: Payer: Medicaid Other | Admitting: Obstetrics and Gynecology

## 2018-03-22 ENCOUNTER — Ambulatory Visit (INDEPENDENT_AMBULATORY_CARE_PROVIDER_SITE_OTHER): Payer: Medicaid Other | Admitting: Obstetrics and Gynecology

## 2018-03-22 ENCOUNTER — Encounter: Payer: Self-pay | Admitting: Obstetrics and Gynecology

## 2018-03-22 VITALS — BP 120/70 | Wt 176.0 lb

## 2018-03-22 DIAGNOSIS — Z3A36 36 weeks gestation of pregnancy: Secondary | ICD-10-CM

## 2018-03-22 DIAGNOSIS — Z3400 Encounter for supervision of normal first pregnancy, unspecified trimester: Secondary | ICD-10-CM

## 2018-03-22 DIAGNOSIS — R8271 Bacteriuria: Secondary | ICD-10-CM
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
## 2018-03-28 ENCOUNTER — Ambulatory Visit (INDEPENDENT_AMBULATORY_CARE_PROVIDER_SITE_OTHER): Payer: Medicaid Other | Admitting: Obstetrics and Gynecology

## 2018-03-28 VITALS — BP 122/56 | Wt 178.0 lb

## 2018-03-28 DIAGNOSIS — Z3A37 37 weeks gestation of pregnancy: Secondary | ICD-10-CM

## 2018-03-28 DIAGNOSIS — Z6791 Unspecified blood type, Rh negative: Secondary | ICD-10-CM

## 2018-03-28 DIAGNOSIS — Z3400 Encounter for supervision of normal first pregnancy, unspecified trimester: Secondary | ICD-10-CM

## 2018-03-28 DIAGNOSIS — R8271 Bacteriuria: Secondary | ICD-10-CM

## 2018-03-28 DIAGNOSIS — O26899 Other specified pregnancy related conditions, unspecified trimester: Secondary | ICD-10-CM
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
## 2018-03-28 NOTE — Progress Notes (Signed)
ROB

## 2018-03-30 LAB — GC/CHLAMYDIA PROBE AMP
Chlamydia trachomatis, NAA: NEGATIVE
NEISSERIA GONORRHOEAE BY PCR: NEGATIVE

## 2018-04-02 NOTE — Progress Notes (Signed)
Negative GC/CT, released to [employer]

## 2018-04-04 ENCOUNTER — Encounter: Payer: Self-pay | Admitting: Obstetrics and Gynecology

## 2018-04-04 ENCOUNTER — Ambulatory Visit (INDEPENDENT_AMBULATORY_CARE_PROVIDER_SITE_OTHER): Payer: Medicaid Other | Admitting: Obstetrics and Gynecology

## 2018-04-04 VITALS — BP 118/74 | Wt 182.0 lb

## 2018-04-04 DIAGNOSIS — R8271 Bacteriuria: Secondary | ICD-10-CM

## 2018-04-04 DIAGNOSIS — O26899 Other specified pregnancy related conditions, unspecified trimester: Secondary | ICD-10-CM

## 2018-04-04 DIAGNOSIS — Z3A38 38 weeks gestation of pregnancy: Secondary | ICD-10-CM

## 2018-04-04 DIAGNOSIS — Z3400 Encounter for supervision of normal first pregnancy, unspecified trimester: Secondary | ICD-10-CM

## 2018-04-04 DIAGNOSIS — Z6791 Unspecified blood type, Rh negative: Secondary | ICD-10-CM

## 2018-04-04 NOTE — Patient Instructions (Signed)
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
## 2018-04-10 ENCOUNTER — Inpatient Hospital Stay
Admission: EM | Admit: 2018-04-10 | Discharge: 2018-04-12 | DRG: 806 | Disposition: A | Discharge status: Home / Self Care | Payer: Medicaid Other | Attending: Maternal Newborn | Admitting: Maternal Newborn

## 2018-04-10 ENCOUNTER — Inpatient Hospital Stay: Payer: Medicaid Other | Admitting: Anesthesiology

## 2018-04-10 ENCOUNTER — Other Ambulatory Visit: Payer: Self-pay

## 2018-04-10 DIAGNOSIS — Z3A39 39 weeks gestation of pregnancy: Secondary | ICD-10-CM

## 2018-04-10 DIAGNOSIS — O9903 Anemia complicating the puerperium: Secondary | ICD-10-CM | POA: Diagnosis present

## 2018-04-10 DIAGNOSIS — O26893 Other specified pregnancy related conditions, third trimester: Secondary | ICD-10-CM | POA: Diagnosis present

## 2018-04-10 DIAGNOSIS — D649 Anemia, unspecified: Secondary | ICD-10-CM | POA: Diagnosis present

## 2018-04-10 DIAGNOSIS — O9902 Anemia complicating childbirth: Secondary | ICD-10-CM | POA: Diagnosis present

## 2018-04-10 DIAGNOSIS — Z6791 Unspecified blood type, Rh negative: Secondary | ICD-10-CM | POA: Diagnosis not present

## 2018-04-10 DIAGNOSIS — O4292 Full-term premature rupture of membranes, unspecified as to length of time between rupture and onset of labor: Principal | ICD-10-CM | POA: Diagnosis present

## 2018-04-10 DIAGNOSIS — O4290 Premature rupture of membranes, unspecified as to length of time between rupture and onset of labor, unspecified weeks of gestation: Secondary | ICD-10-CM | POA: Diagnosis present

## 2018-04-10 HISTORY — DX: Premature rupture of membranes, unspecified as to length of time between rupture and onset of labor, unspecified weeks of gestation: O42.90

## 2018-04-10 LAB — CBC
HCT: 32.4 % — ABNORMAL LOW (ref 35.0–47.0)
Hemoglobin: 10.7 g/dL — ABNORMAL LOW (ref 12.0–16.0)
MCH: 26.5 pg (ref 26.0–34.0)
MCHC: 33.1 g/dL (ref 32.0–36.0)
MCV: 80.1 fL (ref 80.0–100.0)
PLATELETS: 337 10*3/uL (ref 150–440)
RBC: 4.04 MIL/uL (ref 3.80–5.20)
RDW: 14.6 % — AB (ref 11.5–14.5)
WBC: 11.8 10*3/uL — AB (ref 3.6–11.0)

## 2018-04-10 LAB — TYPE AND SCREEN
ABO/RH(D): O NEG
ANTIBODY SCREEN: NEGATIVE

## 2018-04-10 MED ORDER — ACETAMINOPHEN 325 MG PO TABS: 650.0000 mg | ORAL_TABLET | ORAL | Status: DC | PRN

## 2018-04-10 MED ORDER — LACTATED RINGERS IV SOLN: 500.0000 mL | INTRAVENOUS | Status: DC | PRN

## 2018-04-10 MED ORDER — TERBUTALINE SULFATE 1 MG/ML IJ SOLN
0.2500 mg | Freq: Once | INTRAMUSCULAR | Status: DC | PRN
Start: 2018-04-10 — End: 2018-04-11

## 2018-04-10 MED ORDER — LACTATED RINGERS IV SOLN: 500.0000 mL | Freq: Once | INTRAVENOUS | Status: DC

## 2018-04-10 MED ORDER — FENTANYL 2.5 MCG/ML W/ROPIVACAINE 0.15% IN NS 100 ML EPIDURAL (ARMC)
EPIDURAL | Status: AC
  Filled 2018-04-10: qty 100

## 2018-04-10 MED ORDER — EPHEDRINE 5 MG/ML INJ
10.0000 mg | INTRAVENOUS | Status: DC | PRN
  Filled 2018-04-10: qty 2

## 2018-04-10 MED ORDER — SODIUM CHLORIDE 0.9 % IV SOLN
5.0000 10*6.[IU] | Freq: Once | INTRAVENOUS | Status: AC
  Administered 2018-04-10: 5 10*6.[IU] via INTRAVENOUS
  Filled 2018-04-10 (×2): qty 5

## 2018-04-10 MED ORDER — FENTANYL 2.5 MCG/ML W/ROPIVACAINE 0.15% IN NS 100 ML EPIDURAL (ARMC)
12.0000 mL/h | EPIDURAL | Status: DC
  Administered 2018-04-10: 12 mL/h via EPIDURAL

## 2018-04-10 MED ORDER — LACTATED RINGERS IV SOLN
INTRAVENOUS | Status: DC
  Administered 2018-04-10: 07:00:00 via INTRAVENOUS

## 2018-04-10 MED ORDER — LIDOCAINE-EPINEPHRINE (PF) 1.5 %-1:200000 IJ SOLN
INTRAMUSCULAR | Status: DC | PRN
  Administered 2018-04-10: 3 mL

## 2018-04-10 MED ORDER — DIPHENHYDRAMINE HCL 50 MG/ML IJ SOLN: 12.5000 mg | INTRAMUSCULAR | Status: DC | PRN

## 2018-04-10 MED ORDER — SODIUM CHLORIDE 0.9 % IV SOLN
INTRAVENOUS | Status: DC | PRN
  Administered 2018-04-10 (×3): 5 mL via EPIDURAL

## 2018-04-10 MED ORDER — PHENYLEPHRINE 40 MCG/ML (10ML) SYRINGE FOR IV PUSH (FOR BLOOD PRESSURE SUPPORT)
80.0000 ug | PREFILLED_SYRINGE | INTRAVENOUS | Status: DC | PRN
  Filled 2018-04-10: qty 5

## 2018-04-10 MED ORDER — BUTORPHANOL TARTRATE 1 MG/ML IJ SOLN: 1.0000 mg | INTRAMUSCULAR | Status: DC | PRN

## 2018-04-10 MED ORDER — PENICILLIN G POT IN DEXTROSE 60000 UNIT/ML IV SOLN
3.0000 10*6.[IU] | INTRAVENOUS | Status: DC
  Administered 2018-04-10 (×2): 3 10*6.[IU] via INTRAVENOUS
  Filled 2018-04-10 (×9): qty 50

## 2018-04-10 MED ORDER — AMMONIA AROMATIC IN INHA
RESPIRATORY_TRACT | Status: AC
  Filled 2018-04-10: qty 10

## 2018-04-10 MED ORDER — LIDOCAINE HCL (PF) 1 % IJ SOLN
INTRAMUSCULAR | Status: AC
  Filled 2018-04-10: qty 30

## 2018-04-10 MED ORDER — OXYTOCIN 40 UNITS IN LACTATED RINGERS INFUSION - SIMPLE MED
1.0000 m[IU]/min | INTRAVENOUS | Status: DC
  Administered 2018-04-10: 2 m[IU]/min via INTRAVENOUS

## 2018-04-10 MED ORDER — ONDANSETRON HCL 4 MG/2ML IJ SOLN
4.0000 mg | Freq: Four times a day (QID) | INTRAMUSCULAR | Status: DC | PRN
  Administered 2018-04-10: 4 mg via INTRAVENOUS
  Filled 2018-04-10: qty 2

## 2018-04-10 MED ORDER — OXYTOCIN 40 UNITS IN LACTATED RINGERS INFUSION - SIMPLE MED
2.5000 [IU]/h | INTRAVENOUS | Status: DC
  Filled 2018-04-10 (×2): qty 1000

## 2018-04-10 MED ORDER — OXYTOCIN 10 UNIT/ML IJ SOLN
INTRAMUSCULAR | Status: AC
  Filled 2018-04-10: qty 2

## 2018-04-10 MED ORDER — OXYTOCIN BOLUS FROM INFUSION
500.0000 mL | Freq: Once | INTRAVENOUS | Status: AC
  Administered 2018-04-10: 500 mL via INTRAVENOUS

## 2018-04-10 MED ORDER — MISOPROSTOL 200 MCG PO TABS
ORAL_TABLET | ORAL | Status: AC
  Filled 2018-04-10: qty 4

## 2018-04-10 NOTE — Discharge Summary (Signed)
OB Discharge Summary     Patient Name: Rachel Montes DOB: 05-17-1986 MRN: [id number]  Date of admission: 04/10/2018 Delivering Provider: Oswaldo Conroy, CNM  Date of Delivery: 04/10/2018  Date of discharge: 04/12/2018  Admitting diagnosis: 39 wks water broke Intrauterine pregnancy: [redacted]w[redacted]d     Secondary diagnosis: None     Discharge diagnosis: Term Pregnancy Delivered, Shoulder dystocia, anemia, postpartum hemorrhage                  Hospital course:  Onset of Labor With Vaginal Delivery     32 y.o. yo G3P1011 at [redacted]w[redacted]d was admitted in Active Labor on 04/10/2018. Patient had a labor course complicated by shoulder dystocia and postpartum hemorrhage. Other details as follows:  Membrane Rupture Time/Date: 3:50 AM ,04/10/2018   Intrapartum Procedures: Episiotomy: None [1]                                         Lacerations:  1st degree [2]  Patient had a delivery of a Viable infant. 04/10/2018  Information for the patient's newborn:  Frederich Chick Girl Abree [[id number]]  Delivery Method: Vag-Spont    Pateint had a postpartum course complicated by anemia with a hemoglobin of 8 gm/dl.  She is ambulating without lightheadedness and is tolerating a regular diet, passing flatus, and urinating well. Patient is discharged home in stable condition on 04/12/2018                                                                  Post partum procedures:rhogam  Complications: Shoulder dystocia, PPH  Physical exam on 04/12/2018: Vitals:   04/11/18 1546 04/11/18 1911 04/12/18 0019 04/12/18 0802  BP: 95/65 (!) 88/51 (!) 92/54 96/66  Pulse: 75 84 (!) 58 65  Resp: Temp: 98.1 F (36.7 C) 98.6 F (37 C) 98 F (36.7 C) 98.8 F (37.1 C)  TempSrc: Oral Oral Oral Oral  SpO2: 98% 100% 96% 99%  Weight:      Height:       General: alert, cooperative and no distress Lochia: appropriate Uterine Fundus: firm/ U-2/ML/NT Incision: clean dry and intact DVT Evaluation: No evidence of DVT seen on physical  exam.  Labs: Lab Results  Component Value Date   WBC 14.0 (H) 04/11/2018   HGB 8.0 (L) 04/11/2018   HCT 23.8 (L) 04/11/2018   MCV 80.8 04/11/2018   PLT 241 04/11/2018   CMP Latest Ref Rng & Units 08/11/2017  Glucose 65 - 99 mg/dL 96  BUN 6 - 20 mg/dL 11  Creatinine 0.86 - 5.78 mg/dL 4.69  Sodium 629 - 528 mmol/L 138  Potassium 3.5 - 5.1 mmol/L 3.6  Chloride 101 - 111 mmol/L 106  CO2 22 - 32 mmol/L 24  Calcium 8.9 - 10.3 mg/dL 9.2  Total Protein 6.5 - 8.1 g/dL 7.4  Total Bilirubin 0.3 - 1.2 mg/dL 0.4  Alkaline Phos 38 - 126 U/L 74  AST 15 - 41 U/L 25  ALT 14 - 54 U/L 21    Discharge instruction: per After Visit Summary.  Medications:  Allergies as of 04/12/2018   No Known Allergies     Medication  List    TAKE these medications   acetaminophen 325 MG tablet Commonly known as:  TYLENOL Take 2 tablets (650 mg total) by mouth every 4 (four) hours as needed for mild pain or moderate pain.   FUSION 65-65-25-30 MG Caps Take 1 tablet by mouth daily.   ibuprofen 600 MG tablet Commonly known as:  ADVIL,MOTRIN Take 1 tablet (600 mg total) by mouth every 6 (six) hours as needed for mild pain, moderate pain or cramping.   multivitamin-prenatal 27-0.8 MG Tabs tablet Take 1 tablet by mouth daily at 12 noon.   norethindrone 0.35 MG tablet Commonly known as:  MICRONOR,CAMILA,ERRIN Take 1 tablet (0.35 mg total) by mouth daily. Start taking on:  05/07/2018       Diet: routine diet  Activity: Advance as tolerated. Pelvic rest for 6 weeks.   Outpatient follow up: Follow-up Information    Oswaldo Conroy, CNM. Schedule an appointment as soon as possible for a visit in 6 week(s).   Specialty:  Certified Nurse Midwife Contact information: [address] [place] [address] [phone]             Postpartum contraception: Progesterone only pills Rhogam Given postpartum: yes, baby O pos Rubella vaccine given postpartum: ordered pp Varicella vaccine given  postpartum: no TDaP given antepartum or postpartum: yes  Newborn Data: Live born female / Zollie Scale Birth Weight: 8 lb 9.9 oz (3910 g) APGAR: 8, 9  Newborn Delivery   Time head delivered:  04/10/2018 22:36:00 Birth date/time:  04/10/2018 22:37:00 Delivery type:  Vaginal, Spontaneous      Baby Feeding: Breast  Disposition:home with mother  SIGNED: Farrel Conners, CNM

## 2018-04-10 NOTE — Progress Notes (Signed)
  Labor Progress Note   32 y.o. [id number] @ [redacted]w[redacted]d , admitted for  Pregnancy, Labor Management, SROM  Subjective:  Comfortable, sitting upright in bed, endorses mild to moderate discomfort with contractions.  Objective:  BP 105/62 (BP Location: Left Arm)   Pulse 87   Temp 98 F (36.7 C) (Oral)   Resp 16   Ht  (1.727 m)   Wt 182 lb (82.6 kg)   LMP 07/01/2017   BMI 27.67 kg/m  Abd: mild Extr: trace to 1+ bilateral pedal edema SVE: CERVIX: 6 cm dilated, 80% effaced, -2 station (RN exam)  EFM: FHR: 130 bpm, variability: moderate,  accelerations:  Present,  decelerations:  Absent Toco: Frequency: Every 5-10 minutes, Duration: 40-120 seconds and Intensity: mild to moderate Labs: I have reviewed the patient's lab results.   Assessment & Plan:  [id number] @ [redacted]w[redacted]d, admitted for  Pregnancy and Labor/Delivery Management, SROM.  1. Pain management: plans epidural 2. FWB: FHT category I.  3. ID: GBS bacteruria, continue antibiotics 4. Labor management: Augment with Pitocin due to slow progress and inadequate uterine activity with SROM. She will ask for epidural when ready. Anticipate SVD.  All discussed with patient, see orders.  Marcelyn Bruins, CNM 04/10/2018  2:33 PM

## 2018-04-10 NOTE — Anesthesia Preprocedure Evaluation (Addendum)
Anesthesia Evaluation  Patient identified by MRN, date of birth, ID band Patient awake    Reviewed: Allergy & Precautions, NPO status , Patient's Chart, lab work & pertinent test results  History of Anesthesia Complications Negative for: history of anesthetic complications  Airway Mallampati: II  TM Distance: >3 FB Neck ROM: Full    Dental no notable dental hx.    Pulmonary neg pulmonary ROS, neg sleep apnea, neg COPD,    breath sounds clear to auscultation- rhonchi (-) wheezing      Cardiovascular Exercise Tolerance: Good (-) hypertension(-) CAD and (-) Past MI  Rhythm:Regular Rate:Normal - Systolic murmurs and - Diastolic murmurs    Neuro/Psych negative neurological ROS  negative psych ROS   GI/Hepatic negative GI ROS, Neg liver ROS,   Endo/Other  negative endocrine ROSneg diabetes  Renal/GU negative Renal ROS     Musculoskeletal negative musculoskeletal ROS (+)   Abdominal Gravid abdomen  Peds  Hematology negative hematology ROS (+)   Anesthesia Other Findings G2P1  Reproductive/Obstetrics (+) Pregnancy                             Lab Results  Component Value Date   WBC 11.8 (H) 04/10/2018   HGB 10.7 (L) 04/10/2018   HCT 32.4 (L) 04/10/2018   MCV 80.1 04/10/2018   PLT 337 04/10/2018    Anesthesia Physical Anesthesia Plan  ASA: II  Anesthesia Plan: Epidural   Post-op Pain Management:    Induction:   PONV Risk Score and Plan: 2  Airway Management Planned:   Additional Equipment:   Intra-op Plan:   Post-operative Plan:   Informed Consent: I have reviewed the patients History and Physical, chart, labs and discussed the procedure including the risks, benefits and alternatives for the proposed anesthesia with the patient or authorized representative who has indicated his/her understanding and acceptance.     Plan Discussed with: CRNA and  Anesthesiologist  Anesthesia Plan Comments: (Plan for epidural for labor, discussed epidural vs spinal vs GA if need for csection)        Anesthesia Quick Evaluation

## 2018-04-10 NOTE — Anesthesia Procedure Notes (Signed)
Epidural Patient location during procedure: OB Start time: 04/10/2018 3:30 PM End time: 04/10/2018 3:48 PM  Staffing Anesthesiologist: Alver Fisher, MD Performed: anesthesiologist   Preanesthetic Checklist Completed: patient identified, site marked, surgical consent, pre-op evaluation, timeout performed, IV checked, risks and benefits discussed and monitors and equipment checked  Epidural Patient position: sitting Prep: ChloraPrep Patient monitoring: heart rate, continuous pulse ox and blood pressure Approach: midline Location: L3-L4 Injection technique: LOR saline  Needle:  Needle type: Tuohy  Needle gauge: 18 G Needle length: 9 cm and 9 Needle insertion depth: 5 cm Catheter type: closed end flexible Catheter size: 20 Guage Catheter at skin depth: 9 cm Test dose: negative (0.125% bupivacaine)  Assessment Events: blood not aspirated, injection not painful, no injection resistance, negative IV test and no paresthesia  Additional Notes   Patient tolerated the insertion well without complications.Reason for block:procedure for pain

## 2018-04-10 NOTE — H&P (Signed)
Obstetrics Admission History & Physical   CC: SROM  HPI:  32 y.o. G3P1011 @ [redacted]w[redacted]d (04/15/2018, by Ultrasound). Admitted on 04/10/2018:   Patient Active Problem List   Diagnosis Date Noted  . SPROM (prolonged spontaneous rupture of membranes) 04/10/2018  . Rh negative state in antepartum period 12/27/2017  . Supervision of normal first pregnancy, antepartum 10/01/2017  . Low grade squamous intraepith lesion on cytologic smear cervix (lgsil) 10/01/2017  . GBS bacteriuria 10/01/2017    Presents for SROM at 340am with mild ctxs afterwards. No VB.   Prenatal care at: at [facility]. Pregnancy complicated by none. Low grade squamous intraepith lesion on cytologic smear cervix (lgsil); GBS bacteriuria; and Rh negative state  ROS: A review of systems was performed and negative, except as stated in the above HPI. PMHx:  Past Medical History:  Diagnosis Date  . Family history of ovarian cancer    11/18 genetic testing letter sent   PSHx: History reviewed. No pertinent surgical history. Medications:  Medications Prior to Admission  Medication Sig Dispense Refill Last Dose  . Prenatal Vit-Fe Fumarate-FA (MULTIVITAMIN-PRENATAL) 27-0.8 MG TABS tablet Take 1 tablet by mouth daily at 12 noon.   04/09/2018 at Unknown time   Allergies: has No Known Allergies. OBHx:  OB History  Gravida Para Term Preterm AB Living  SAB TAB Ectopic Multiple Live Births          1    # Outcome Date GA Lbr Len/2nd Weight Sex Delivery Anes PTL Lv  3 Current           2 Term 01/06/14   7 lb 9 oz (3.43 kg) F Vag-Spont  N LIV  1 AB            UUV:OZDGUYQI/HKVQQVZDGLOV except as detailed in HPI.Marland Kitchen  No family history of birth defects. Soc Hx: Never smoker, Alcohol: none and Recreational drug use: none  Objective:   Vitals:   04/10/18 0607  BP: 118/70  Pulse: 98  Resp: 18  Temp: 98.2 F (36.8 C)   Constitutional: Well nourished, well developed female in no acute distress.  HEENT: normal Skin: Warm  and dry.  Cardiovascular:Regular rate and rhythm.   Extremity: trace to 1+ bilateral pedal edema Respiratory: Clear to auscultation bilateral. Normal respiratory effort Abdomen: gravid NT ND FHT 140s Back: no CVAT Neuro: DTRs 2+, Cranial nerves grossly intact Psych: Alert and Oriented x3. No memory deficits. Normal mood and affect.  MS: normal gait, normal bilateral lower extremity ROM/strength/stability.  Pelvic exam: is not limited by body habitus EGBUS: within normal limits Vagina: within normal limits and with normal mucosa blood in the vault Cervix: 4-5/80/-3 Uterus: Spontaneous uterine activity  Adnexa: not evaluated  EFM:FHR: 140 bpm, variability: moderate,  accelerations:  Present,  decelerations:  Absent Toco: Frequency: Every 6-10 minutes   Perinatal info:  Blood type: O negative Rubella- Equivacle Varicella -Immune TDaP Given during third trimester of this pregnancy RPR NR / HIV Neg/ HBsAg Neg, GBS POS  Assessment & Plan:   32 y.o. G3P1011 @ [redacted]w[redacted]d, Admitted on 04/10/2018: SROM, Early Labor   Admit for labor, Antibiotics for GBS prophylaxis, Fetal Wellbeing Reassuring and Epidural when ready Counseled as to possible need for Pitocin to augment labor if does not progress naturally in a given amount of time Breast feeding plans Unsure BC Needs PAP PP  Annamarie Major, MD, [facility], [facility] 04/10/2018  7:12 AM

## 2018-04-11 ENCOUNTER — Encounter: Payer: Self-pay | Admitting: Obstetrics and Gynecology

## 2018-04-11 ENCOUNTER — Encounter: Payer: Medicaid Other | Admitting: Certified Nurse Midwife

## 2018-04-11 LAB — CBC
HCT: 23.8 % — ABNORMAL LOW (ref 35.0–47.0)
Hemoglobin: 8 g/dL — ABNORMAL LOW (ref 12.0–16.0)
MCH: 27.1 pg (ref 26.0–34.0)
MCHC: 33.6 g/dL (ref 32.0–36.0)
MCV: 80.8 fL (ref 80.0–100.0)
PLATELETS: 241 10*3/uL (ref 150–440)
RBC: 2.95 MIL/uL — AB (ref 3.80–5.20)
RDW: 14.9 % — AB (ref 11.5–14.5)
WBC: 14 10*3/uL — ABNORMAL HIGH (ref 3.6–11.0)

## 2018-04-11 LAB — GLUCOSE, CAPILLARY: Glucose-Capillary: 110 mg/dL — ABNORMAL HIGH (ref 65–99)

## 2018-04-11 LAB — RPR: RPR Ser Ql: NONREACTIVE

## 2018-04-11 MED ORDER — IBUPROFEN 600 MG PO TABS
600.0000 mg | ORAL_TABLET | Freq: Four times a day (QID) | ORAL | Status: DC
  Administered 2018-04-11 – 2018-04-12 (×6): 600 mg via ORAL
  Filled 2018-04-11 (×6): qty 1

## 2018-04-11 MED ORDER — COCONUT OIL OIL: 1.0000 "application " | TOPICAL_OIL | Status: DC | PRN

## 2018-04-11 MED ORDER — PRENATAL MULTIVITAMIN CH
1.0000 | ORAL_TABLET | Freq: Every day | ORAL | Status: DC
  Administered 2018-04-11 – 2018-04-12 (×2): 1 via ORAL
  Filled 2018-04-11 (×2): qty 1

## 2018-04-11 MED ORDER — MEASLES, MUMPS & RUBELLA VAC ~~LOC~~ INJ
0.5000 mL | INJECTION | Freq: Once | SUBCUTANEOUS | Status: DC
  Filled 2018-04-11 (×3): qty 0.5

## 2018-04-11 MED ORDER — DIPHENHYDRAMINE HCL 25 MG PO CAPS: 25.0000 mg | ORAL_CAPSULE | Freq: Four times a day (QID) | ORAL | Status: DC | PRN

## 2018-04-11 MED ORDER — WITCH HAZEL-GLYCERIN EX PADS: 1.0000 "application " | MEDICATED_PAD | CUTANEOUS | Status: DC | PRN

## 2018-04-11 MED ORDER — SIMETHICONE 80 MG PO CHEW: 80.0000 mg | CHEWABLE_TABLET | ORAL | Status: DC | PRN

## 2018-04-11 MED ORDER — BENZOCAINE-MENTHOL 20-0.5 % EX AERO: 1.0000 "application " | INHALATION_SPRAY | CUTANEOUS | Status: DC | PRN

## 2018-04-11 MED ORDER — SENNOSIDES-DOCUSATE SODIUM 8.6-50 MG PO TABS
2.0000 | ORAL_TABLET | ORAL | Status: DC
  Administered 2018-04-11 – 2018-04-12 (×2): 2 via ORAL
  Filled 2018-04-11 (×2): qty 2

## 2018-04-11 MED ORDER — OXYCODONE-ACETAMINOPHEN 5-325 MG PO TABS: 1.0000 | ORAL_TABLET | ORAL | Status: DC | PRN

## 2018-04-11 MED ORDER — OXYCODONE-ACETAMINOPHEN 5-325 MG PO TABS: 2.0000 | ORAL_TABLET | ORAL | Status: DC | PRN

## 2018-04-11 MED ORDER — ONDANSETRON HCL 4 MG PO TABS: 4.0000 mg | ORAL_TABLET | ORAL | Status: DC | PRN

## 2018-04-11 MED ORDER — ONDANSETRON HCL 4 MG/2ML IJ SOLN: 4.0000 mg | INTRAMUSCULAR | Status: DC | PRN

## 2018-04-11 MED ORDER — DIBUCAINE 1 % RE OINT: 1.0000 "application " | TOPICAL_OINTMENT | RECTAL | Status: DC | PRN

## 2018-04-11 MED ORDER — FERROUS SULFATE 325 (65 FE) MG PO TABS
325.0000 mg | ORAL_TABLET | Freq: Three times a day (TID) | ORAL | Status: DC
  Administered 2018-04-11 – 2018-04-12 (×4): 325 mg via ORAL
  Filled 2018-04-11 (×4): qty 1

## 2018-04-11 MED ORDER — ACETAMINOPHEN 325 MG PO TABS: 650.0000 mg | ORAL_TABLET | ORAL | Status: DC | PRN

## 2018-04-11 NOTE — Progress Notes (Signed)
Post Partum Day 1 Subjective: Doing well, no complaints.  Tolerating regular diet, pain with PO meds, voiding and ambulating without difficulty.  No CP SOB F/C N/V or leg pain No HA, change of vision, RUQ/epigastric pain  Objective: BP (!) 91/59 (BP Location: Right Arm)   Pulse 65   Temp 97.9 F (36.6 C) (Oral)   Resp 18   Ht  (1.727 m)   Wt 182 lb (82.6 kg)   LMP 07/01/2017   SpO2 99%   Breastfeeding  BMI 27.67 kg/m    Physical Exam:  General: NAD CV: RRR Pulm: nl effort, CTABL Lochia: moderate Uterine Fundus: fundus firm and below umbilicus DVT Evaluation: no cords, ttp LEs   Recent Labs    04/10/18 0656 04/11/18 0637  HGB 10.7* 8.0*  HCT 32.4* 23.8*  WBC 11.8* 14.0*  PLT 337 241    Assessment/Plan: 32 y.o. [id number] postpartum day # 1  1. Continue routine postpartum care 2. O negative Rhogam indicated prior to discharge- baby is O positive 3. Rubella Equivocal- patient is undecided about vaccination 4. Varicella Immune 5. TDAP given antepartum 6. Breastfeeding/Contraception: Progesterone-only pill 7. Disposition: Discharge to home Day 2    Tresea Mall, CNM [facility]

## 2018-04-11 NOTE — Lactation Note (Signed)
This note was copied from a baby's chart. Lactation Consultation Note  Patient Name: Rachel Montes Today's Date: 04/11/2018   During morning rounds, mom said breastfeeding was going "well" and she had no questions/needs at the time. She has LC contact info     Maternal Data    Feeding Feeding Type: Breast Fed  LATCH Score                   Interventions    Lactation Tools Discussed/Used     Consult Status      Sunday Corn 04/11/2018, 5:28 PM

## 2018-04-12 DIAGNOSIS — O9903 Anemia complicating the puerperium: Secondary | ICD-10-CM | POA: Diagnosis present

## 2018-04-12 LAB — FETAL SCREEN: FETAL SCREEN: NEGATIVE

## 2018-04-12 MED ORDER — FUSION 65-65-25-30 MG PO CAPS: 1.0000 | ORAL_CAPSULE | Freq: Every day | ORAL | 1 refills | Status: DC

## 2018-04-12 MED ORDER — RHO D IMMUNE GLOBULIN 1500 UNIT/2ML IJ SOSY
300.0000 ug | PREFILLED_SYRINGE | Freq: Once | INTRAMUSCULAR | Status: AC
  Administered 2018-04-12: 300 ug via INTRAVENOUS
  Filled 2018-04-12: qty 2

## 2018-04-12 MED ORDER — IBUPROFEN 600 MG PO TABS: 600.0000 mg | ORAL_TABLET | Freq: Four times a day (QID) | ORAL | 1 refills | Status: DC | PRN

## 2018-04-12 MED ORDER — ACETAMINOPHEN 325 MG PO TABS: 650.0000 mg | ORAL_TABLET | ORAL | Status: DC | PRN

## 2018-04-12 MED ORDER — NORETHINDRONE 0.35 MG PO TABS: 1.0000 | ORAL_TABLET | Freq: Every day | ORAL | 11 refills | Status: DC

## 2018-04-12 NOTE — Progress Notes (Signed)
Period of purple cry video watched by mother. Mother verbalized understanding and had no questions. Mother given a copy of video to take home with her. 

## 2018-04-12 NOTE — Discharge Instructions (Signed)
°Vaginal Delivery, Care After °Refer to this sheet in the next few weeks. These discharge instructions provide you with information on caring for yourself after delivery. Your caregiver may also give you specific instructions. Your treatment has been planned according to the most current medical practices available, but problems sometimes occur. Call your caregiver if you have any problems or questions after you go home. °HOME CARE INSTRUCTIONS °1. Take over-the-counter or prescription medicines only as directed by your caregiver or pharmacist. °2. Do not drink alcohol, especially if you are breastfeeding or taking medicine to relieve pain. °3. Do not smoke tobacco. °4. Continue to use good perineal care. Good perineal care includes: °1. Wiping your perineum from back to front °2. Keeping your perineum clean. °3. You can do sitz baths twice a day, to help keep this area clean °5. Do not use tampons, douche or have sex for 6 weeks °6. Shower only and avoid sitting in submerged water, aside from sitz baths °7. Wear a well-fitting bra that provides breast support. °8. Eat healthy foods. °9. Drink enough fluids to keep your urine clear or pale yellow. °10. Eat high-fiber foods such as whole grain cereals and breads, brown rice, beans, and fresh fruits and vegetables every day. These foods may help prevent or relieve constipation. °11. Avoid constipation with high fiber foods or medications, such as miralax or metamucil °12. Follow your caregiver's recommendations regarding resumption of activities such as climbing stairs, driving, lifting, exercising, or traveling. °13. Talk to your caregiver about resuming sexual activities. Resumption of sexual activities after 6 weeks is dependent upon your risk of infection, your rate of healing, and your comfort and desire to resume sexual activity. °14. Try to have someone help you with your household activities and your newborn for at least a few days after you leave the  hospital. °15. Rest as much as possible. Try to rest or take a nap when your newborn is sleeping. °16. Increase your activities gradually. °17. Keep all of your scheduled postpartum appointments. It is very important to keep your scheduled follow-up appointments. At these appointments, your caregiver will be checking to make sure that you are healing physically and emotionally. °SEEK MEDICAL CARE IF:  °· You are passing large clots from your vagina. Save any clots to show your caregiver. °· You have a foul smelling discharge from your vagina. °· You have trouble urinating. °· You are urinating frequently. °· You have pain when you urinate. °· You have a change in your bowel movements. °· You have increasing redness, pain, or swelling near your vaginal incision (episiotomy) or vaginal tear. °· You have pus draining from your episiotomy or vaginal tear. °· Your episiotomy or vaginal tear is separating. °· You have painful, hard, or reddened breasts. °· You have a severe headache. °· You have blurred vision or see spots. °· You feel sad or depressed. °· You have thoughts of hurting yourself or your newborn. °· You have questions about your care, the care of your newborn, or medicines. °· You are dizzy or light-headed. °· You have a rash. °· You have nausea or vomiting. °· You were breastfeeding and have not had a menstrual period within 12 weeks after you stopped breastfeeding. °· You are not breastfeeding and have not had a menstrual period by the 12th week after delivery. °· You have a fever of 100.5 or more °SEEK IMMEDIATE MEDICAL CARE IF:  °· You have persistent pain. °· You have chest pain. °· You have shortness   of breath. °· You faint. °· You have leg pain. °· You have stomach pain. °· Your vaginal bleeding saturates two or more sanitary pads in 1 hour. °MAKE SURE YOU:  °· Understand these instructions. °· Will watch your condition. °· Will get help right away if you are not doing well or get worse. °Document  Released: 11/19/2000 Document Revised: 04/08/2014 Document Reviewed: 07/19/2012 °ExitCare® Patient Information ©2015 [employer]. This information is not intended to replace advice given to you by your health care provider. Make sure you discuss any questions you have with your health care provider. ° °Sitz Bath °A sitz bath is a warm water bath taken in the sitting position. The water covers only the hips and butt (buttocks). We recommend using one that fits in the toilet, to help with ease of use and cleanliness. It may be used for either healing or cleaning purposes. Sitz baths are also used to relieve pain, itching, or muscle tightening (spasms). The water may contain medicine. Moist heat will help you heal and relax.  °HOME CARE  °Take 3 to 4 sitz baths a day. °18. Fill the bathtub half-full with warm water. °19. Sit in the water and open the drain a little. °20. Turn on the warm water to keep the tub half-full. Keep the water running constantly. °21. Soak in the water for 15 to 20 minutes. °22. After the sitz bath, pat the affected area dry. °GET HELP RIGHT AWAY IF: °You get worse instead of better. Stop the sitz baths if you get worse. °MAKE SURE YOU: °· Understand these instructions. °· Will watch your condition. °· Will get help right away if you are not doing well or get worse. °Document Released: 12/30/2004 Document Revised: 08/16/2012 Document Reviewed: 03/22/2011 °ExitCare® Patient Information ©2015 [employer]. This information is not intended to replace advice given to you by your health care provider. Make sure you discuss any questions you have with your health care provider. ° ° °

## 2018-04-12 NOTE — Anesthesia Postprocedure Evaluation (Signed)
Anesthesia Post Note  Patient: [employer]  Procedure(s) Performed: AN AD HOC LABOR EPIDURAL  Patient location during evaluation: Mother Baby Anesthesia Type: Epidural Level of consciousness: awake and alert Pain management: pain level controlled Vital Signs Assessment: post-procedure vital signs reviewed and stable Respiratory status: spontaneous breathing, nonlabored ventilation and respiratory function stable Cardiovascular status: stable Postop Assessment: no headache, no backache, epidural receding and able to ambulate Anesthetic complications: no     Last Vitals:  Vitals:   04/11/18 1911 04/12/18 0019  BP: (!) 88/51 (!) 92/54  Pulse: 84 (!) 58  Resp: 20 18  Temp: 37 C 36.7 C  SpO2: 100% 96%    Last Pain:  Vitals:   04/12/18 0309  TempSrc:   PainSc: Asleep                 Jules Schick

## 2018-04-12 NOTE — Progress Notes (Signed)
Patient discharged home with infant. Discharge instructions, prescriptions and follow up appointment given to and reviewed with patient. Patient verbalized understanding. Patient wheeled out by auxiliary.   Will get MMR at pp follow up visit 

## 2018-04-13 LAB — RHOGAM INJECTION: UNIT DIVISION: 0

## 2018-05-24 ENCOUNTER — Encounter: Payer: Self-pay | Admitting: Certified Nurse Midwife

## 2018-05-24 ENCOUNTER — Ambulatory Visit (INDEPENDENT_AMBULATORY_CARE_PROVIDER_SITE_OTHER): Payer: Medicaid Other | Admitting: Certified Nurse Midwife

## 2018-05-24 VITALS — BP 98/58 | HR 66 | Ht 68.0 in | Wt 164.0 lb

## 2018-05-24 DIAGNOSIS — D649 Anemia, unspecified: Secondary | ICD-10-CM

## 2018-05-24 DIAGNOSIS — R87810 Cervical high risk human papillomavirus (HPV) DNA test positive: Secondary | ICD-10-CM

## 2018-05-24 DIAGNOSIS — R85612 Low grade squamous intraepithelial lesion on cytologic smear of anus (LGSIL): Secondary | ICD-10-CM

## 2018-05-24 LAB — POCT HEMOGLOBIN: Hemoglobin: 11.2 g/dL — AB (ref 12.2–16.2)

## 2018-05-24 MED ORDER — HYDROCORTISONE ACE-PRAMOXINE 2.5-1 % RE CREA
1.0000 | TOPICAL_CREAM | Freq: Three times a day (TID) | RECTAL | 0 refills | Status: DC
Start: 2018-05-24 — End: 2019-04-25

## 2018-05-24 NOTE — Progress Notes (Addendum)
Postpartum Visit  Chief Complaint:  Chief Complaint  Patient presents with  . Routine Prenatal Visit    History of Present Illness: Rachel Montes is a 32 y.o. [id number] WF who presents for her 6 week  postpartum visit.  Date of delivery: 04/10/2018 Type of delivery: Vaginal delivery - Vacuum or forceps assisted  no Episiotomy No.  Laceration: yes, first degree perineal laceration Pregnancy or labor problems:  Yes, LGSIL Pap, shoulder dystocia, and PPH with postpartum anemia (hemoglobin 8.0 gm/dl Any problems since the delivery:  Yes, blood and pain with stooling. No hard stools. No straining. Bleeding stopped a couple of weeks ago. Baby growing well. Baby easily comforted Newborn Details:  SINGLETON :  1. Baby's name: Rachel Montes. Birth weight: 8#10 oz Maternal Details:  Breast Feeding:  yes Post partum depression/anxiety noted:  no Edinburgh Post-Partum Depression Score:  2  Date of last PAP: 09/21/2017  LGSIL with positive HRHPV. Colpo c/w Pap smear  Review of Systems: ROS-negative except as stated in the HPI  Past Medical History:  Past Medical History:  Diagnosis Date  . Family history of ovarian cancer    11/18 genetic testing letter sent  . History of abnormal cervical Papanicolaou smear   . 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 Postpartum hemorrhage 04/2018  . Shoulder dystocia during labor and delivery 04/2018    Past Surgical History:  Past Surgical History:  Procedure Laterality Date  . COLPOSCOPY  12/2017    Family History:  Family History  Problem Relation Age of Onset  . Breast cancer Mother 2659  . Ovarian cancer Paternal Grandmother   . Ovarian cancer Paternal Aunt     Social History:  Social History   Socioeconomic History  . Marital status: Married    Spouse name: Not on file  . Number of children: 2  . Years of education: Not on file  . Highest education level: Not on file  Occupational History  . Not on file  Social Needs  .  Financial resource strain: Not on file  . Food insecurity:    Worry: Not on file    Inability: Not on file  . Transportation needs:    Medical: Not on file    Non-medical: Not on file  Tobacco Use  . Smoking status: Never Smoker  . Smokeless tobacco: Never Used  Substance and Sexual Activity  . Alcohol use: Not Currently  . Drug use: No  . Sexual activity: Not Currently    Birth control/protection: Pill  Lifestyle  . Physical activity:    Days per week: Not on file    Minutes per session: Not on file  . Stress: Not on file  Relationships  . Social connections:    Talks on phone: Not on file    Gets together: Not on file    Attends religious service: Not on file    Active member of club or organization: Not on file    Attends meetings of clubs or organizations: Not on file    Relationship status: Not on file  . Intimate partner violence:    Fear of current or ex partner: Not on file    Emotionally abused: Not on file    Physically abused: Not on file    Forced sexual activity: Not on file  Other Topics Concern  . Not on file  Social History Narrative  . Not on file    Allergies:  No Known Allergies  Medications: Prior to  Admission medications   Medication Sig Start Date End Date Taking? Authorizing Provider  acetaminophen (TYLENOL) 325 MG tablet Take 2 tablets (650 mg total) by mouth every 4 (four) hours as needed for mild pain or moderate pain. 04/12/18   Farrel Conners, CNM  Fe Fum-Fe Poly-Vit C-Lactobac (FUSION) 65-65-25-30 MG CAPS Take 1 tablet by mouth daily. 04/12/18   Farrel Conners, CNM  ibuprofen (ADVIL,MOTRIN) 600 MG tablet Take 1 tablet (600 mg total) by mouth every 6 (six) hours as needed for mild pain, moderate pain or cramping. 04/12/18   Farrel Conners, CNM  norethindrone (MICRONOR,CAMILA,ERRIN) 0.35 MG tablet Take 1 tablet (0.35 mg total) by mouth daily. 05/07/18   Farrel Conners, CNM  Prenatal Vit-Fe Fumarate-FA (MULTIVITAMIN-PRENATAL) 27-0.8  MG TABS tablet Take 1 tablet by mouth daily at 12 noon.    [provider]    Physical Exam Vitals: BP (!) 98/58   Pulse 66   Ht 5\' 8"  (1.727 m)   Wt 164 lb (74.4 kg)   Breastfeeding? Yes   BMI 24.94 kg/m  General: WF in  NAD HEENT: normocephalic, anicteric Neck: No thyroid enlargement, no palpable nodules, no cervical lymphadenpathy Breast: Lactating, no inflammation, no masses, nipples intact Pulmonary: No increased work of breathing, CTAB Abdomen: Soft, non-tender, non-distended.  Umbilicus without lesions.  No hepatomegaly or masses palpable. No evidence of hernia. Genitourinary:  External: Well healed perineum, no lesions or inflammation    Vagina: Normal vaginal mucosa, no evidence of prolapse.    Cervix: Grossly normal in appearance, no bleeding  Uterus: Well involuted, mobile, non-tender  Adnexa: No adnexal masses, non-tender  Rectal: there is a small fissure at 7 o'clock at anus, no external hemorrhoids noted Extremities: no edema, erythema, or tenderness Neurologic: Grossly intact Psychiatric: mood appropriate, affect full Results for orders placed or performed in visit on 05/24/18 (from the past 24 hour(s))  POCT hemoglobin     Status: Abnormal   Collection Time: 05/24/18  3:41 PM  Result Value Ref Range   Hemoglobin 11.2 (A) 12.2 - 16.2 g/dL   Assessment: 32 y.o. [id number] presenting for 6 week postpartum visit Hx of LGSIL Pap Anal fissure Plan:   1) Contraception:. Patient would like to continue the progesterone only pill for contraception.  2)  Pap done-will call with results. FU pending results.  3) Patient underwent screening for postpartum depression with no concerns noted.  4) Discussed return to normal activity, recommend continuing prenatal vitamins. Can discontinue iron supplements.  5) Analpram 2.5%-apply perianally tid x 10 days-if bleeding and pain persist, will refer to surgeon or GI.  Farrel Conners, CNM

## 2018-05-28 LAB — IGP, APTIMA HPV
HPV APTIMA: POSITIVE — AB
PAP Smear Comment: 0

## 2018-05-29 ENCOUNTER — Encounter (INDEPENDENT_AMBULATORY_CARE_PROVIDER_SITE_OTHER): Payer: Self-pay

## 2018-06-06 DIAGNOSIS — B078 Other viral warts: Secondary | ICD-10-CM | POA: Diagnosis not present

## 2019-03-13 ENCOUNTER — Other Ambulatory Visit: Payer: Self-pay | Admitting: Certified Nurse Midwife

## 2019-03-20 ENCOUNTER — Encounter: Payer: Self-pay | Admitting: *Deleted

## 2019-03-20 ENCOUNTER — Emergency Department: Payer: Medicaid Other

## 2019-03-20 ENCOUNTER — Other Ambulatory Visit: Payer: Self-pay

## 2019-03-20 ENCOUNTER — Emergency Department
Admission: EM | Admit: 2019-03-20 | Discharge: 2019-03-20 | Disposition: A | Discharge status: Home / Self Care | Payer: Medicaid Other | Attending: Emergency Medicine | Admitting: Emergency Medicine

## 2019-03-20 DIAGNOSIS — R0789 Other chest pain: Secondary | ICD-10-CM | POA: Insufficient documentation

## 2019-03-20 DIAGNOSIS — Z79899 Other long term (current) drug therapy: Secondary | ICD-10-CM | POA: Insufficient documentation

## 2019-03-20 LAB — BASIC METABOLIC PANEL
Anion gap: 10 (ref 5–15)
BUN: 11 mg/dL (ref 6–20)
CO2: 28 mmol/L (ref 22–32)
Calcium: 9.6 mg/dL (ref 8.9–10.3)
Chloride: 102 mmol/L (ref 98–111)
Creatinine, Ser: 0.61 mg/dL (ref 0.44–1.00)
GFR calc Af Amer: >60 mL/min (ref >60)
GFR calc non Af Amer: >60 mL/min (ref >60)
Glucose, Bld: 106 mg/dL — ABNORMAL HIGH (ref 70–99)
Potassium: 3.8 mmol/L (ref 3.5–5.1)
Sodium: 140 mmol/L (ref 135–145)

## 2019-03-20 LAB — CBC
HCT: 38.9 % (ref 36.0–46.0)
Hemoglobin: 12.9 g/dL (ref 12.0–15.0)
MCH: 28.4 pg (ref 26.0–34.0)
MCHC: 33.2 g/dL (ref 30.0–36.0)
MCV: 85.7 fL (ref 80.0–100.0)
Platelets: 299 10*3/uL (ref 150–400)
RBC: 4.54 MIL/uL (ref 3.87–5.11)
RDW: 13.7 % (ref 11.5–15.5)
WBC: 9.9 10*3/uL (ref 4.0–10.5)
nRBC: 0 % (ref 0.0–0.2)

## 2019-03-20 LAB — FIBRIN DERIVATIVES D-DIMER (ARMC ONLY): Fibrin derivatives D-dimer (ARMC): 328.53 ng/mL (FEU) (ref 0.00–499.00)

## 2019-03-20 LAB — TROPONIN I: Troponin I: <0.03 ng/mL (ref <0.03)

## 2019-03-20 MED ORDER — TERBINAFINE HCL 1 % EX CREA: 1.0000 "application " | TOPICAL_CREAM | Freq: Two times a day (BID) | CUTANEOUS | 0 refills | Status: DC

## 2019-03-20 MED ORDER — IBUPROFEN 600 MG PO TABS: 600.0000 mg | ORAL_TABLET | Freq: Four times a day (QID) | ORAL | 0 refills | Status: DC | PRN

## 2019-03-20 NOTE — ED Provider Notes (Signed)
[facility] Emergency Department Provider Note ____________________________________________   First MD Initiated Contact with Patient 03/20/19 2030     (approximate)  I have reviewed the triage vital signs and the nursing notes.   HISTORY  Chief Complaint Chest Pain    HPI Rachel Montes is a 33 y.o. female with PMH as noted below who presents with chest pain, acute onset over the last day, mainly on the left side of her chest and radiated into her left shoulder and back.  The patient states that she initially had pain more in the upper back and shoulder.  The pain got worse today after she fell asleep lying down with her daughter.  It is worse with movements of the arm and with certain changes in position.  She denies associated shortness of breath, weakness or lightheadedness, cough, fever, or any nausea or vomiting.  No prior history of this pain.  She not take anything for it at home because she is breast-feeding.  She is on OCPs.  She denies any leg pain or swelling.  Past Medical History:  Diagnosis Date  . Family history of ovarian cancer    11/18 genetic testing letter sent  . History of abnormal cervical Papanicolaou smear   . 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 Postpartum hemorrhage 04/2018  . Shoulder dystocia during labor and delivery 04/2018    Patient Active Problem List   Diagnosis Date Noted  . Postpartum care following vaginal delivery 04/12/2018  . Anemia in pregnancy, delivered, with postpartum complication, current hospitalization 04/12/2018  . Shoulder dystocia during labor and delivery, delivered 04/12/2018  . SPROM (prolonged spontaneous rupture of membranes) 04/10/2018  . Rh negative state in antepartum period 12/27/2017  . Supervision of normal first pregnancy, antepartum 10/01/2017  . Low grade squamous intraepith lesion on cytologic smear cervix (lgsil) 10/01/2017  . GBS bacteriuria 10/01/2017    Past Surgical  History:  Procedure Laterality Date  . COLPOSCOPY  12/2017    Prior to Admission medications   Medication Sig Start Date End Date Taking? Authorizing Provider  acetaminophen (TYLENOL) 325 MG tablet Take 2 tablets (650 mg total) by mouth every 4 (four) hours as needed for mild pain or moderate pain. Patient not taking: Reported on 05/24/2018 04/12/18   Farrel Conners, CNM  CAMILA 0.35 MG tablet TAKE 1 TABLET BY MOUTH EVERY DAY 03/13/19   Farrel Conners, CNM  Fe Fum-Fe Poly-Vit C-Lactobac (FUSION) 65-65-25-30 MG CAPS Take 1 tablet by mouth daily. 04/12/18   Farrel Conners, CNM  hydrocortisone-pramoxine [facility]) 2.5-1 % rectal cream Place 1 application rectally 3 (three) times daily. 05/24/18   Farrel Conners, CNM  ibuprofen (ADVIL,MOTRIN) 600 MG tablet Take 1 tablet (600 mg total) by mouth every 6 (six) hours as needed. 03/20/19   Dionne Bucy, MD  Prenatal Vit-Fe Fumarate-FA (MULTIVITAMIN-PRENATAL) 27-0.8 MG TABS tablet Take 1 tablet by mouth daily at 12 noon.    [provider]  terbinafine (LAMISIL) 1 % cream Apply 1 application topically 2 (two) times daily. 03/20/19   Dionne Bucy, MD    Allergies Patient has no known allergies.  Family History  Problem Relation Age of Onset  . Breast cancer Mother 19  . Ovarian cancer Paternal Grandmother   . Ovarian cancer Paternal Aunt     Social History Social History   Tobacco Use  . Smoking status: Never Smoker  . Smokeless tobacco: Never Used  Substance Use Topics  . Alcohol use: Not Currently  .  Drug use: No    Review of Systems  Constitutional: No fever. Eyes: No redness. ENT: No sore throat. Cardiovascular: Positive for chest pain. Respiratory: Denies shortness of breath. Gastrointestinal: No vomiting or diarrhea.  Genitourinary: Negative for flank pain.  Musculoskeletal: Positive for back pain. Skin: Negative for rash. Neurological: Negative for headache.    ____________________________________________   PHYSICAL EXAM:  VITAL SIGNS: ED Triage Vitals  Enc Vitals Group     BP 03/20/19 2015 133/84     Pulse Rate 03/20/19 2015 80     Resp 03/20/19 2015 20     Temp 03/20/19 2015 97.9 F (36.6 C)     Temp Source 03/20/19 2015 Oral     SpO2 03/20/19 2015 100 %     Weight 03/20/19 2015 165 lb (74.8 kg)     Height 03/20/19 2015  (1.702 m)     Head Circumference --      Peak Flow --      Pain Score 03/20/19 2014 8     Pain Loc --      Pain Edu? --      Excl. in GC? --     Constitutional: Alert and oriented. Well appearing and in no acute distress. Eyes: Conjunctivae are normal.  Head: Atraumatic. Nose: No congestion/rhinnorhea. Mouth/Throat: Mucous membranes are moist.   Neck: Normal range of motion.  Cardiovascular: Normal rate, regular rhythm. Good peripheral circulation. Respiratory: Normal respiratory effort.  No retractions.  Gastrointestinal: Soft and nontender. No distention.  Genitourinary: No CVA tenderness. Musculoskeletal: No lower extremity edema.  Extremities warm and well perfused.  Left lateral chest wall and thoracic back muscular tenderness, reproducing the pain. Neurologic:  Normal speech and language. No gross focal neurologic deficits are appreciated.  Skin:  Skin is warm and dry. No rash noted. Psychiatric: Mood and affect are normal. Speech and behavior are normal.  ____________________________________________   LABS (all labs ordered are listed, but only abnormal results are displayed)  Labs Reviewed  BASIC METABOLIC PANEL - Abnormal; Notable for the following components:      Result Value   Glucose, Bld 106 (*)    All other components within normal limits  CBC  TROPONIN I  FIBRIN DERIVATIVES D-DIMER ([facility] ONLY)  POC URINE PREG, ED   ____________________________________________  EKG  ED ECG REPORT I, Dionne Bucy, the attending physician, personally viewed and interpreted this ECG.   Date: 03/20/2019 EKG Time: 2016 Rate: 82 Rhythm: normal sinus rhythm QRS Axis: normal Intervals: normal ST/T Wave abnormalities: normal Narrative Interpretation: no evidence of acute ischemia  ____________________________________________  RADIOLOGY  CXR: No focal infiltrate or other acute abnormality  ____________________________________________   PROCEDURES  Procedure(s) performed: No  Procedures  Critical Care performed: No ____________________________________________   INITIAL IMPRESSION / ASSESSMENT AND PLAN / ED COURSE  Pertinent labs & imaging results that were available during my care of the patient were reviewed by me and considered in my medical decision making (see chart for details).  33 year old female with PMH as noted above presents with left chest pain radiating around the left side of her thorax to her back, worsened after taking a nap with her child and exacerbated with movement of the arm or changes in position.  On exam, the patient is well-appearing.  Her vital signs are normal.  She has reproducible left lateral chest wall tenderness.  The remainder of the exam is unremarkable.  EKG is normal.  Overall presentation is consistent with musculoskeletal chest wall pain since it  is reproducible with movement and palpation.  Since the patient is on OCPs, I [facility]  I have a low suspicion for ACS given her age, lack of risk factors, and the normal EKG.  There is no evidence of vascular etiology.  We will obtain basic labs, troponin x1, d-dimer, chest x-ray, and reassess.  ----------------------------------------- 10:44 PM on 03/20/2019 -----------------------------------------  Lab work-up including d-dimer and troponin is within normal limits.  Chest x-ray is also negative.  The patient continues to feel comfortable.  At this time she is stable for discharge home.  I counseled her on the results of the work-up.  The patient  also mentioned to me a fungal rash on her hands for which she has previously been treated successfully with terbinafine.  I agreed to prescribe the same for her as well as ibuprofen for the chest wall pain.  I answered all the patient's questions.  Return precautions given, and she expresses understanding. ____________________________________________   FINAL CLINICAL IMPRESSION(S) / ED DIAGNOSES  Final diagnoses:  Chest wall pain      NEW MEDICATIONS STARTED DURING THIS VISIT:  New Prescriptions   IBUPROFEN (ADVIL,MOTRIN) 600 MG TABLET    Take 1 tablet (600 mg total) by mouth every 6 (six) hours as needed.   TERBINAFINE (LAMISIL) 1 % CREAM    Apply 1 application topically 2 (two) times daily.     Note:  This document was prepared using Dragon voice recognition software and may include unintentional dictation errors.    Dionne BucySiadecki, Sanaii Caporaso, MD 03/20/19 2245

## 2019-03-20 NOTE — Discharge Instructions (Addendum)
Return to the ER for new, worsening, or persistent severe chest pain, difficulty breathing, weakness or lightheadedness, or any other new or worsening symptoms that concern you. 

## 2019-03-20 NOTE — ED Notes (Signed)
Pt updated regarding D-dimer labwork turn around time. Pt verbalizes understanding, call bell at right side, vss.

## 2019-03-20 NOTE — ED Triage Notes (Signed)
Pt ambulatory to triage.   Pt has left side chest pain with chest pressure.  No sob.  No cough.  Nonsmoker.  No n/v/  Pt alert   Speech clear.

## 2019-04-25 ENCOUNTER — Other Ambulatory Visit: Payer: Self-pay

## 2019-04-25 ENCOUNTER — Encounter: Payer: Self-pay | Admitting: Emergency Medicine

## 2019-04-25 ENCOUNTER — Ambulatory Visit
Admission: EM | Admit: 2019-04-25 | Discharge: 2019-04-25 | Disposition: A | Discharge status: Home / Self Care | Payer: Medicaid Other

## 2019-04-25 DIAGNOSIS — H6121 Impacted cerumen, right ear: Secondary | ICD-10-CM

## 2019-04-25 NOTE — ED Provider Notes (Signed)
[facility] ____________________________________________  Time seen: Approximately 10:11 AM  I have reviewed the triage vital signs and the nursing notes.   HISTORY  Chief Complaint Ear Fullness   HPI Rachel Montes is a 33 y.o. female presenting for evaluation of right ear fullness present for the last 2 weeks.  States ear feels sore and has had muffled hearing.  States she has a history of similar happening a few months ago in her left ear and it was wax impaction.  Does also report intermittent TMJ issues, but denies current TMJ issues.  Denies recent cough, congestion, sore throat, fevers, chest pain, shortness of breath.  Reports otherwise feels well.  Denies current pregnancy.  Is still nursing a 25-year-old.  Denies aggravating alleviating factors.  Denies ear drainage.  No trauma or injury.   Past Medical History:  Diagnosis Date  . Family history of ovarian cancer    11/18 genetic testing letter sent  . History of abnormal cervical Papanicolaou smear   . 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 Postpartum hemorrhage 04/2018  . Shoulder dystocia during labor and delivery 04/2018    Patient Active Problem List   Diagnosis Date Noted  . Postpartum care following vaginal delivery 04/12/2018  . Anemia in pregnancy, delivered, with postpartum complication, current hospitalization 04/12/2018  . Shoulder dystocia during labor and delivery, delivered 04/12/2018  . SPROM (prolonged spontaneous rupture of membranes) 04/10/2018  . Rh negative state in antepartum period 12/27/2017  . Supervision of normal first pregnancy, antepartum 10/01/2017  . Low grade squamous intraepith lesion on cytologic smear cervix (lgsil) 10/01/2017  . GBS bacteriuria 10/01/2017    Past Surgical History:  Procedure Laterality Date  . COLPOSCOPY  12/2017     No current facility-administered medications for this encounter.  No current outpatient medications on file.  Allergies  Patient has no known allergies.  Family History  Problem Relation Age of Onset  . Breast cancer Mother 7  . Ovarian cancer Paternal Grandmother   . Ovarian cancer Paternal Aunt     Social History Social History   Tobacco Use  . Smoking status: Never Smoker  . Smokeless tobacco: Never Used  Substance Use Topics  . Alcohol use: Not Currently  . Drug use: No    Review of Systems Constitutional: No fever ENT: No sore throat. As above.  Cardiovascular: Denies chest pain. Respiratory: Denies shortness of breath. Gastrointestinal: No abdominal pain.  Neurological: Negative for headaches.  ____________________________________________   PHYSICAL EXAM:  VITAL SIGNS: ED Triage Vitals  Enc Vitals Group     BP 04/25/19 0947 121/74     Pulse Rate 04/25/19 0947 89     Resp 04/25/19 0947 18     Temp 04/25/19 0947 99 F (37.2 C)     Temp Source 04/25/19 0947 Oral     SpO2 04/25/19 0947 98 %     Weight 04/25/19 0946 150 lb (68 kg)     Height 04/25/19 0946 5\' 7"  (1.702 m)     Head Circumference --      Peak Flow --      Pain Score 04/25/19 0946 0     Pain Loc --      Pain Edu? --      Excl. in GC? --    Constitutional: Alert and oriented. Well appearing and in no acute distress. Eyes: Conjunctivae are normal.  Head: Atraumatic. No sinus tenderness to palpation. No swelling. No erythema.  Ears:  Left: Nontender, normal  canal, no erythema, normal TM  Right: Minimal tenderness auricle movement, total cerumen impaction in canal.  Post cerumen removal, canal clear, minimal canal excoriation, no edema, no erythema, normal TM.  Nose:No nasal congestion  Neck: No stridor.  No cervical spine tenderness to palpation. Hematological/Lymphatic/Immunilogical: No cervical lymphadenopathy. Cardiovascular: Normal rate, regular rhythm. Grossly normal heart sounds.  Good peripheral circulation. Respiratory: Normal respiratory effort.  No retractions. No wheezes, rales or rhonchi. Good air  movement.  Musculoskeletal: Ambulatory with steady gait. Neurologic:  Normal speech and language. No gait instability. Skin:  Skin appears warm, dry. Psychiatric: Mood and affect are normal. Speech and behavior are normal. ___________________________________________   LABS (all labs ordered are listed, but only abnormal results are displayed)  Labs Reviewed - No data to display ____________________________________________   PROCEDURES Procedures   Right ear total cerumen impaction.  Cerumen removed by RN with irrigation.  Patient tolerated well.  INITIAL IMPRESSION / ASSESSMENT AND PLAN / ED COURSE  Pertinent labs & imaging results that were available during my care of the patient were reviewed by me and considered in my medical decision making (see chart for details).  Well-appearing patient.  Right cerumen impaction.  Removed.  Patient reports immediately feeling better and hearing improved.  Supportive care.  Discussed follow up and return parameters including no resolution or any worsening concerns. Patient verbalized understanding and agreed to plan.   ____________________________________________   FINAL CLINICAL IMPRESSION(S) / ED DIAGNOSES  Final diagnoses:  Impacted cerumen of right ear     ED Discharge Orders    None       Note: This dictation was prepared with Dragon dictation along with smaller phrase technology. Any transcriptional errors that result from this process are unintentional.         Renford DillsMiller, Rashawn Rolon, NP 04/25/19 1034

## 2019-04-25 NOTE — ED Triage Notes (Signed)
Patient c/o right ear fullness and ringing in her ear x 2 weeks.

## 2019-04-25 NOTE — Discharge Instructions (Addendum)
° °  Follow up with your primary care physician this week as needed. Return to Urgent care for new or worsening concerns.  ° °

## 2019-06-08 ENCOUNTER — Other Ambulatory Visit: Payer: Self-pay | Admitting: Certified Nurse Midwife

## 2019-06-08 DIAGNOSIS — Z308 Encounter for other contraceptive management: Secondary | ICD-10-CM

## 2019-06-11 NOTE — Telephone Encounter (Signed)
Tried calling pt to see if wants refill - mailbox full.

## 2019-06-12 NOTE — Telephone Encounter (Signed)
Pt would like to have her refill sent in to [facility] in [place].  Thanks

## 2019-07-06 ENCOUNTER — Other Ambulatory Visit (HOSPITAL_COMMUNITY)
Admission: RE | Admit: 2019-07-06 | Discharge: 2019-07-06 | Disposition: A | Discharge status: Home / Self Care | Payer: Medicaid Other | Source: Ambulatory Visit | Attending: Certified Nurse Midwife | Admitting: Certified Nurse Midwife

## 2019-07-06 ENCOUNTER — Other Ambulatory Visit: Payer: Self-pay

## 2019-07-06 ENCOUNTER — Encounter: Payer: Self-pay | Admitting: Certified Nurse Midwife

## 2019-07-06 ENCOUNTER — Ambulatory Visit (INDEPENDENT_AMBULATORY_CARE_PROVIDER_SITE_OTHER): Payer: Medicaid Other | Admitting: Certified Nurse Midwife

## 2019-07-06 VITALS — BP 110/64 | Ht 67.0 in | Wt 164.8 lb

## 2019-07-06 DIAGNOSIS — Z01419 Encounter for gynecological examination (general) (routine) without abnormal findings: Secondary | ICD-10-CM

## 2019-07-06 DIAGNOSIS — Z0000 Encounter for general adult medical examination without abnormal findings: Secondary | ICD-10-CM

## 2019-07-06 DIAGNOSIS — Z1151 Encounter for screening for human papillomavirus (HPV): Secondary | ICD-10-CM

## 2019-07-06 DIAGNOSIS — R87810 Cervical high risk human papillomavirus (HPV) DNA test positive: Secondary | ICD-10-CM

## 2019-07-06 DIAGNOSIS — Z124 Encounter for screening for malignant neoplasm of cervix: Secondary | ICD-10-CM | POA: Diagnosis present

## 2019-07-06 DIAGNOSIS — Z8742 Personal history of other diseases of the female genital tract: Secondary | ICD-10-CM

## 2019-07-06 DIAGNOSIS — Z8041 Family history of malignant neoplasm of ovary: Secondary | ICD-10-CM

## 2019-07-06 DIAGNOSIS — Z803 Family history of malignant neoplasm of breast: Secondary | ICD-10-CM

## 2019-07-06 NOTE — Progress Notes (Addendum)
Gynecology Annual Exam  PCP: Patient, No Pcp Per  Chief Complaint:  Chief Complaint  Patient presents with  . Gynecologic Exam    History of Present Illness: Rachel Montes is a 33 y.o. NA,[id number], who presents for her annual exam. The patient has no complaints today.  Her menses are absent due to breast feeding her 95 month old baby.  Last pap smear: 05/24/18, results were NIL/positive HRHPV. Previous Pap 09/21/2017 was LGSIL with positive HRHPV   The patient is  sexually active. She currently uses the progesterone only pill for contraception. She does not have dyspareunia.   Her past medical history is remarkable for a postpartum hemorrhage and shoulder dystocia with her delivery   The patient does perform self breast exams. Her last mammogram was NA.  There is a family history of breast cancer in her mother at age 73. Genetic testing has not been done.   There is a family history of ovarian cancer in her PGM and paternal aunt Genetic testing has not been done.  The patient denies smoking.  She denies drinking.  She denies illegal drug use.  The patient does not exercise, but is active caring for a 35yr old and a 25 mos old.  She does get adequate calcium in her diet.  The patient denies current symptoms of depression.    Review of Systems: Review of Systems  Constitutional: Negative for chills, fever and weight loss.  HENT: Negative for congestion, sinus pain and sore throat.   Eyes: Positive for blurred vision. Negative for pain.  Respiratory: Negative for hemoptysis, shortness of breath and wheezing.   Cardiovascular: Negative for chest pain, palpitations and leg swelling.  Gastrointestinal: Negative for abdominal pain, blood in stool, diarrhea, heartburn, nausea and vomiting.  Genitourinary: Negative for dysuria, frequency, hematuria and urgency.       Positive for amenorrhea  Musculoskeletal: Negative for back pain, joint pain and myalgias.  Skin:  Negative for itching and rash.  Neurological: Negative for dizziness, tingling and headaches.  Endo/Heme/Allergies: Negative for environmental allergies and polydipsia. Does not bruise/bleed easily.       Negative for hirsutism   Psychiatric/Behavioral: Negative for depression. The patient is not nervous/anxious and does not have insomnia.     Past Medical History:  Past Medical History:  Diagnosis Date  . Family history of ovarian cancer    11/18 genetic testing letter sent  . History of abnormal cervical Papanicolaou smear   . 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 Postpartum hemorrhage 04/2018  . Shoulder dystocia during labor and delivery 04/2018    Past Surgical History:  Past Surgical History:  Procedure Laterality Date  . COLPOSCOPY  12/2017    Family History:  Family History  Problem Relation Age of Onset  . Breast cancer Mother 88  . Ovarian cancer Paternal Grandmother   . Ovarian cancer Paternal Aunt 73       has contact  . Cervical cancer Maternal Grandmother     Social History:  Social History   Socioeconomic History  . Marital status: Married    Spouse name: Not on file  . Number of children: 2  . Years of education: Not on file  . Highest education level: Not on file  Occupational History  . Not on file  Social Needs  . Financial resource strain: Not on file  . Food insecurity    Worry: Not on file    Inability: Not on file  .  Transportation needs    Medical: Not on file    Non-medical: Not on file  Tobacco Use  . Smoking status: Never Smoker  . Smokeless tobacco: Never Used  Substance and Sexual Activity  . Alcohol use: Not Currently  . Drug use: No  . Sexual activity: Yes    Birth control/protection: Pill  Lifestyle  . Physical activity    Days per week: Not on file    Minutes per session: Not on file  . Stress: Not on file  Relationships  . Social Musicianconnections    Talks on phone: Not on file    Gets together: Not on file     Attends religious service: Not on file    Active member of club or organization: Not on file    Attends meetings of clubs or organizations: Not on file    Relationship status: Not on file  . Intimate partner violence    Fear of current or ex partner: Not on file    Emotionally abused: Not on file    Physically abused: Not on file    Forced sexual activity: Not on file  Other Topics Concern  . Not on file  Social History Narrative  . Not on file    Allergies:  No Known Allergies  Medications: and prenatal vitamins Current Outpatient Medications on File Prior to Visit  Medication Sig Dispense Refill  . norethindrone (CAMILA) 0.35 MG tablet Take 1 tablet (0.35 mg total) by mouth daily. 84 tablet 0   No current facility-administered medications on file prior to visit.      Physical Exam Vitals:  BP 110/64   Ht 5\' 7"  (1.702 m)   Wt 164 lb 12.8 oz (74.8 kg)   Breastfeeding Yes   BMI 25.81 kg/m   General: WF in NAD presents with her toddler HEENT: normocephalic, anicteric Neck: no thyroid enlargement, no palpable nodules, no cervical lymphadenopathy  Pulmonary: No increased work of breathing, CTAB Cardiovascular: RRR, without murmur  Breast: Breast symmetrical, no tenderness, no palpable nodules or masses, no skin or nipple retraction present, lactating.  No axillary, infraclavicular or supraclavicular lymphadenopathy. Abdomen: Soft, non-tender, non-distended.  Umbilicus without lesions.  No hepatomegaly or masses palpable. No evidence of hernia. Genitourinary:  External: Normal external female genitalia.  Normal urethral meatus, normal Bartholin's and Skene's glands.    Vagina: Normal vaginal mucosa, no evidence of prolapse.    Cervix: Grossly normal in appearance, no bleeding, non-tender  Uterus: Anteverted, normal size, shape, and consistency, mobile, and non-tender  Adnexa: No adnexal masses, non-tender  Rectal: deferred  Lymphatic: no evidence of inguinal  lymphadenopathy Extremities: no edema, erythema, or tenderness Neurologic: Grossly intact Psychiatric: mood appropriate, affect full     Assessment: 33 y.o. [id number] well woman gyn exam Hx of abnormal Pap smear and positive HRHPV. Amenorrhea due to breast feeding Family history of breast and ovarian cancer  Plan:    1) Breast cancer screening - recommend monthly self breast exam. Start mammograms 6 mos after stopping breast feeding. Offered MYRISK testing. [place] Medicaid however, and will not be covered.  2) Cervical cancer screening - Pap was done.   3) Contraception - continue progesterone only pills  4) Follow up pending Pap results.  Farrel Connersolleen Emilea Goga, CNM

## 2019-07-12 LAB — CYTOLOGY - PAP
Diagnosis: NEGATIVE
HPV: DETECTED — AB

## 2019-07-13 ENCOUNTER — Telehealth: Payer: Self-pay

## 2019-07-13 NOTE — Telephone Encounter (Signed)
Pt calling colleen back about her Pap results. Pt is HPV positive. Advised pt colleen is seeing pts and will call her when she can today

## 2019-07-13 NOTE — Telephone Encounter (Signed)
Called and spoke with patient about Pap results. This is the second + HRHPV Pap and the previous one was LGSIL with positive HRHPV. Colposcopy is indicated. Has family planning Medicaid so colposcopy may not be covered. Will see if BCCCP program will help pay for the colposcopy.

## 2019-07-16 ENCOUNTER — Other Ambulatory Visit: Payer: Self-pay | Admitting: Certified Nurse Midwife

## 2019-07-16 DIAGNOSIS — R87810 Cervical high risk human papillomavirus (HPV) DNA test positive: Secondary | ICD-10-CM

## 2019-08-03 ENCOUNTER — Telehealth: Payer: Self-pay | Admitting: *Deleted

## 2019-08-14 ENCOUNTER — Other Ambulatory Visit: Payer: Self-pay

## 2019-08-14 ENCOUNTER — Encounter: Payer: Self-pay | Admitting: *Deleted

## 2019-08-14 ENCOUNTER — Ambulatory Visit: Payer: Medicaid Other | Attending: Oncology | Admitting: *Deleted

## 2019-08-14 DIAGNOSIS — Z9189 Other specified personal risk factors, not elsewhere classified: Secondary | ICD-10-CM

## 2019-08-14 NOTE — Progress Notes (Signed)
  Subjective:     Patient ID: Rachel Montes, female   DOB: December 12, 1985, 33 y.o.   MRN: [id number]  HPI   Review of Systems     Objective:   Physical Exam     Assessment:     33 year old White female referred to [facility] by [place] for financial assistance for further evaluation of recent abnormal pap of HPV + / Negative pap.  Due to COVID 19 pandemic a televisit was used  enroll patient into our BCCCP program.  Verbal consent obtained using 2 identifiers.  Patient with history of abnormal paps as follows. 09/21/17 LGSIL HPV +, 05/24/18 negative / HPV + and 07/06/19 Negative / HPV +.  Colposcopy with possible biopsy recommended.  Patient has a Tyrer-Cuzick risk score of 39.9% for lifetime risk of breast cancer based on patients family history.  Family history is as follows:  Mom with breast cancer at 28, MGM with ovarian cancer at age 47, PGM with ovarian cancer at age 3, Paternal aunt with ovarian cancer at age 35 and lung cancer at age 71.  Discussed need of genetic testing with patient.  She is agreeable to be seen in our high risk clinic.      Plan:     Patient has appointment to see Dr. Gilman Schmidt for colposcopy on 08/23/19.  Will schedule patient to be seen in high risk clinic.  Will follow up per BCCCP protocol.

## 2019-08-14 NOTE — Progress Notes (Addendum)
Called and informed patient that she has an appointment with Dr. Grayland Ormond on 08/17/19 @ 11:15 to discuss possible genetic testing due to her Tyrer-Cuzick score of 39.9%.

## 2019-08-15 ENCOUNTER — Encounter: Payer: Self-pay | Admitting: Obstetrics and Gynecology

## 2019-08-17 ENCOUNTER — Inpatient Hospital Stay: Payer: Medicaid Other | Attending: Oncology | Admitting: Oncology

## 2019-08-17 ENCOUNTER — Encounter: Payer: Self-pay | Admitting: Oncology

## 2019-08-17 ENCOUNTER — Other Ambulatory Visit: Payer: Self-pay

## 2019-08-17 VITALS — BP 107/65 | HR 62 | Temp 98.5°F | Resp 20 | Ht 67.0 in | Wt 161.4 lb

## 2019-08-17 DIAGNOSIS — Z8041 Family history of malignant neoplasm of ovary: Secondary | ICD-10-CM | POA: Insufficient documentation

## 2019-08-17 DIAGNOSIS — Z803 Family history of malignant neoplasm of breast: Secondary | ICD-10-CM | POA: Diagnosis not present

## 2019-08-17 DIAGNOSIS — Z9189 Other specified personal risk factors, not elsewhere classified: Secondary | ICD-10-CM | POA: Diagnosis not present

## 2019-08-17 DIAGNOSIS — R87619 Unspecified abnormal cytological findings in specimens from cervix uteri: Secondary | ICD-10-CM | POA: Insufficient documentation

## 2019-08-17 NOTE — Progress Notes (Signed)
[place]  [phone] [phone]  ID: Rachel Montes OB: 01-14-1986  MR#: [id number]  AOZ#:[id number]  Patient Care Team: Dalia Heading, CNM as PCP - General (Certified Nurse Midwife) Rico Junker, RN as Registered Nurse Theodore Demark, RN as Registered Nurse  CHIEF COMPLAINT: High risk for breast cancer  INTERVAL HISTORY: Patient is a 33 year old female who was referred to the [facility] program for abnormal Pap smear.  Further evaluation revealed patient had a Tyer Cusick risk score of 40%.  She also has an extensive family history of breast cancer and ovarian cancer.  She currently feels well and is asymptomatic.  She continues to breast-feed her 26-month-old daughter.  She has no neurologic complaints.  She denies any recent fevers or illnesses.  She has a good appetite and denies weight loss.  She has no chest pain, shortness of breath, cough, or hemoptysis.  She denies any nausea, vomiting, constipation, or diarrhea.  She has no urinary complaints.  Patient feels at her baseline offers no specific complaints today.  REVIEW OF SYSTEMS:   Review of Systems  Constitutional: Negative.  Negative for fever, malaise/fatigue and weight loss.  Respiratory: Negative.  Negative for cough, hemoptysis and shortness of breath.   Cardiovascular: Negative.  Negative for chest pain and leg swelling.  Gastrointestinal: Negative.  Negative for abdominal pain.  Genitourinary: Negative.  Negative for dysuria.  Musculoskeletal: Negative.  Negative for back pain.  Skin: Negative.  Negative for rash.  Neurological: Negative.  Negative for dizziness, focal weakness, weakness and headaches.  Psychiatric/Behavioral: Negative.  The patient is not nervous/anxious.     As per HPI. Otherwise, a complete review of systems is negative.  PAST MEDICAL HISTORY: Past Medical History:  Diagnosis Date  . Family history of breast cancer   . Family history of ovarian  cancer    genetic testing letter sent 9/20  . GBS bacteriuria 10/01/2017  . History of abnormal cervical Papanicolaou smear   . 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 Postpartum care following vaginal delivery 04/12/2018  . Postpartum hemorrhage 04/2018  . Shoulder dystocia during labor and delivery 04/2018  . SPROM (prolonged spontaneous rupture of membranes) 04/10/2018    PAST SURGICAL HISTORY: Past Surgical History:  Procedure Laterality Date  . COLPOSCOPY  12/2017    FAMILY HISTORY: Family History  Problem Relation Age of Onset  . Breast cancer Mother 40  . Ovarian cancer Paternal Grandmother 78  . Ovarian cancer Paternal Aunt 26       has contact  . Cervical cancer Maternal Grandmother     ADVANCED DIRECTIVES (Y/N):  N  HEALTH MAINTENANCE: Social History   Tobacco Use  . Smoking status: Never Smoker  . Smokeless tobacco: Never Used  Substance Use Topics  . Alcohol use: Not Currently  . Drug use: No     Colonoscopy:  PAP:  Bone density:  Lipid panel:  No Known Allergies  Current Outpatient Medications  Medication Sig Dispense Refill  . norethindrone (CAMILA) 0.35 MG tablet Take 1 tablet (0.35 mg total) by mouth daily. 84 tablet 0   No current facility-administered medications for this visit.     OBJECTIVE: Vitals:   08/17/19 1128  BP: 107/65  Pulse: 62  Resp: 20  Temp: 98.5 F (36.9 C)     Body mass index is 25.28 kg/m.    ECOG FS:0 - Asymptomatic  General: Well-developed, well-nourished, no acute distress. Eyes: Pink conjunctiva, anicteric sclera. HEENT: Normocephalic, moist mucous  membranes, clear oropharnyx. Lungs: Clear to auscultation bilaterally. Heart: Regular rate and rhythm. No rubs, murmurs, or gallops. Abdomen: Soft, nontender, nondistended. No organomegaly noted, normoactive bowel sounds. Musculoskeletal: No edema, cyanosis, or clubbing. Neuro: Alert, answering all questions appropriately. Cranial nerves grossly intact. Skin: No  rashes or petechiae noted. Psych: Normal affect. Lymphatics: No cervical, calvicular, axillary or inguinal LAD.   LAB RESULTS:  Lab Results  Component Value Date   NA 140 03/20/2019   K 3.8 03/20/2019   CL 102 03/20/2019   CO2 28 03/20/2019   GLUCOSE 106 (H) 03/20/2019   BUN 11 03/20/2019   CREATININE 0.61 03/20/2019   CALCIUM 9.6 03/20/2019   PROT 7.4 08/11/2017   ALBUMIN 4.0 08/11/2017   AST 25 08/11/2017   ALT 21 08/11/2017   ALKPHOS 74 08/11/2017   BILITOT 0.4 08/11/2017   GFRNONAA >60 03/20/2019   GFRAA >60 03/20/2019    Lab Results  Component Value Date   WBC 9.9 03/20/2019   NEUTROABS 7.3 (H) 01/24/2018   HGB 12.9 03/20/2019   HCT 38.9 03/20/2019   MCV 85.7 03/20/2019   PLT 299 03/20/2019     STUDIES: No results found.  ASSESSMENT: High risk for breast cancer,  PLAN:    1. High risk for breast cancer: Patient was found to have a Tyer Cusick risk score of 39.9% for lifetime risk of breast cancer.  She also has an extensive family history of her mother with breast cancer at age 35, maternal grandmother with ovarian cancer at the age of 35, paternal grandmother with ovarian cancer at the age of 35, and a paternal aunt with ovarian cancer at the age of 35.  She has never had a mammogram.  After lengthy discussion with the patient, she has agreed to get a screening mammogram but will need to wait until she is no longer breast-feeding.  She may benefit from an MRI in the future, but will discuss this at a later date.  We also discussed at length the benefits of prophylactic tamoxifen.  She is considering this, but will also wait until she has completed breast-feeding.  Given her extensive family history she will definitely benefit from genetic testing and a referral has been made.  She is unaware if any of her family members have been tested.  Patient has been instructed to call the clinic when she has completed breast-feeding so that we can schedule mammogram and have  further discussions regarding tamoxifen.  No follow-up has been scheduled at this time.  I spent a total of 60 minutes face-to-face with the patient of which greater than 50% of the visit was spent in counseling and coordination of care as detailed above.\  Patient expressed understanding and was in agreement with this plan. She also understands that She can call clinic at any time with any questions, concerns, or complaints.    Jeralyn Ruthsimothy J Rochell Mabie, MD   08/17/2019 12:22 PM

## 2019-08-23 ENCOUNTER — Other Ambulatory Visit (HOSPITAL_COMMUNITY)
Admission: RE | Admit: 2019-08-23 | Discharge: 2019-08-23 | Disposition: A | Discharge status: Home / Self Care | Payer: Medicaid Other | Source: Ambulatory Visit | Attending: Obstetrics and Gynecology | Admitting: Obstetrics and Gynecology

## 2019-08-23 ENCOUNTER — Encounter: Payer: Self-pay | Admitting: Obstetrics and Gynecology

## 2019-08-23 ENCOUNTER — Other Ambulatory Visit: Payer: Self-pay

## 2019-08-23 ENCOUNTER — Ambulatory Visit (INDEPENDENT_AMBULATORY_CARE_PROVIDER_SITE_OTHER): Payer: Medicaid Other | Admitting: Obstetrics and Gynecology

## 2019-08-23 VITALS — BP 112/60 | Ht 67.0 in | Wt 162.0 lb

## 2019-08-23 DIAGNOSIS — N871 Moderate cervical dysplasia: Secondary | ICD-10-CM | POA: Insufficient documentation

## 2019-08-23 DIAGNOSIS — B977 Papillomavirus as the cause of diseases classified elsewhere: Secondary | ICD-10-CM

## 2019-08-23 DIAGNOSIS — R87810 Cervical high risk human papillomavirus (HPV) DNA test positive: Secondary | ICD-10-CM | POA: Diagnosis not present

## 2019-08-23 DIAGNOSIS — D069 Carcinoma in situ of cervix, unspecified: Secondary | ICD-10-CM

## 2019-08-23 NOTE — Progress Notes (Signed)
   GYNECOLOGY CLINIC COLPOSCOPY PROCEDURE NOTE  33 y.o. [id number] here for colposcopy for NIL and HR HPV+  pap smear on 07/06/2019. Discussed underlying role for HPV infection in the development of cervical dysplasia, its natural history and progression/regression, need for surveillance.  Is the patient  pregnant: No LMP: No LMP recorded. (Menstrual status: Oral contraceptives). Smoking status:  reports that she has never smoked. She has never used smokeless tobacco. Contraception: oral progesterone-only contraceptive Future fertility desired:  No  Patient given informed consent, signed copy in the chart, time out was performed.  The patient was position in dorsal lithotomy position. Speculum was placed the cervix was visualized.   After application of acetic acid colposcopic inspection of the cervix was undertaken.   Colposcopy adequate, full visualization of transformation zone: Yes acetowhite lesion(s) noted at 12, 10, 6 o'clock; corresponding biopsies obtained.   ECC specimen obtained:  Yes  All specimens were labeled and sent to pathology.   Patient was given post procedure instructions.  Will follow up pathology and manage accordingly.  Routine preventative health maintenance measures emphasized.  Physical Exam Genitourinary:        Adrian Prows MD [facility], [facility] 08/23/2019 4:02 PM

## 2019-08-27 LAB — SURGICAL PATHOLOGY

## 2019-08-29 ENCOUNTER — Encounter: Payer: Self-pay | Admitting: *Deleted

## 2019-08-29 NOTE — Progress Notes (Signed)
Spoke with patient about result- would like a LEEP. Message sent to [place] to schedule.

## 2019-08-29 NOTE — Progress Notes (Signed)
Hi Rachel Montes,  I called and spoke with her about the result. She would like to proceed with the LEEP.  Thank you for speaking to her about medicaid.  Sincerely,  Dr. Gilman Schmidt

## 2019-08-29 NOTE — Progress Notes (Addendum)
Called patient to discuss completing application for BCCCP Medicaid.  No answer.  Left message for her to return my call.  Patient returned my call.  We discussed the need to apply for BCCCP Medicaid due to abnormal cervical biopsy results and need for a LEEP procedure.  Forms completed over the phone.  Patient is to come by tomorrow to sign.

## 2019-08-30 ENCOUNTER — Telehealth: Payer: Self-pay | Admitting: Obstetrics and Gynecology

## 2019-08-30 NOTE — Telephone Encounter (Signed)
Lmtrc

## 2019-08-31 NOTE — Telephone Encounter (Signed)
Patient is aware of LEEP on 09/20/19. Patient confirmed she filled out BCCCP paperwork yesterday.

## 2019-09-01 NOTE — Progress Notes (Signed)
Thanks for the follow up. [employer]

## 2019-09-04 ENCOUNTER — Other Ambulatory Visit: Payer: Self-pay | Admitting: Certified Nurse Midwife

## 2019-09-04 DIAGNOSIS — Z308 Encounter for other contraceptive management: Secondary | ICD-10-CM

## 2019-09-20 ENCOUNTER — Encounter: Payer: Self-pay | Admitting: Obstetrics and Gynecology

## 2019-09-20 ENCOUNTER — Other Ambulatory Visit (HOSPITAL_COMMUNITY)
Admission: RE | Admit: 2019-09-20 | Discharge: 2019-09-20 | Disposition: A | Discharge status: Home / Self Care | Payer: Medicaid Other | Source: Ambulatory Visit | Attending: Obstetrics and Gynecology | Admitting: Obstetrics and Gynecology

## 2019-09-20 ENCOUNTER — Ambulatory Visit (INDEPENDENT_AMBULATORY_CARE_PROVIDER_SITE_OTHER): Payer: Medicaid Other | Admitting: Obstetrics and Gynecology

## 2019-09-20 ENCOUNTER — Other Ambulatory Visit: Payer: Self-pay

## 2019-09-20 VITALS — BP 120/70 | HR 62 | Ht 67.0 in | Wt 163.0 lb

## 2019-09-20 DIAGNOSIS — N870 Mild cervical dysplasia: Secondary | ICD-10-CM | POA: Diagnosis not present

## 2019-09-20 DIAGNOSIS — N871 Moderate cervical dysplasia: Secondary | ICD-10-CM

## 2019-09-20 DIAGNOSIS — D069 Carcinoma in situ of cervix, unspecified: Secondary | ICD-10-CM | POA: Insufficient documentation

## 2019-09-20 NOTE — Patient Instructions (Signed)

## 2019-09-24 NOTE — Progress Notes (Signed)
  LEEP PROCEDURE NOTE  The LEEP has been explained to the patient in detail; risks/benefits reviewed.  The risks include, but are not limited to, bleeding, infection, and the possibility of cervical stenosis or cervical incompetence.  The patient had previously been given information regarding abnormal PAP smears and their relationship to HPV.  We have discussed the natural course and history of HPV, the possibility of incomplete treatment by the LEEP, as well as the possibility of recurrence.  I have reviewed the consent form for LEEP with her, and she fully understands its contents.  We have discussed the procedure itself. I have informed her that following the LEEP she should refrain from intercourse and the use of tampons for three weeks, and that she should also expect some spotting and brown/black discharge over the next several days.  We have discussed the fact that vaginal bleeding, differentiated from spotting, is not normal and that if she should have this complication, she should contact me immediately.  The follow-up after LEEP will be PAP smears or viral typing performed at regular intervals for up to 3-5 years.  Should these all prove to be normal, she will then be back on typical cervical screening.  I have answered all of her questions, and I believe she has an adequate understanding of the LEEP, its implications, and the necessity of follow-up care.  I discussed her colpo results and explained the procedure of LEEP.  All questions were answered and she signed the consent form.    LEEP performed in the usual manner after reviewing the previous colpo findings and results. Acetic acid solution was usesd to identify any abnormal areas of the cervix.  The cervix was cleansed with betadine solution. Local injection of Lidocaine was performed for anesthesia. Ectocervical and then endocervical specimens obtained using the loop electrodes without difficulty.  It was labeled accordingly. The base  and edges of the defect was then cauterized using coagulation current.  Adrian Prows MD [facility] 09/24/2019 12:55 PM

## 2019-09-25 LAB — SURGICAL PATHOLOGY

## 2019-09-25 NOTE — Progress Notes (Signed)
CIN 1, called left generic message, released to mychart.

## 2019-10-05 ENCOUNTER — Other Ambulatory Visit: Payer: Self-pay

## 2019-10-05 ENCOUNTER — Encounter: Payer: Self-pay | Admitting: Emergency Medicine

## 2019-10-05 ENCOUNTER — Ambulatory Visit
Admission: EM | Admit: 2019-10-05 | Discharge: 2019-10-05 | Disposition: A | Discharge status: Home / Self Care | Payer: Medicaid Other | Attending: Family Medicine | Admitting: Family Medicine

## 2019-10-05 ENCOUNTER — Ambulatory Visit: Payer: Medicaid Other

## 2019-10-05 DIAGNOSIS — R29898 Other symptoms and signs involving the musculoskeletal system: Secondary | ICD-10-CM

## 2019-10-05 DIAGNOSIS — M79645 Pain in left finger(s): Secondary | ICD-10-CM | POA: Diagnosis not present

## 2019-10-05 DIAGNOSIS — S62657A Nondisplaced fracture of medial phalanx of left little finger, initial encounter for closed fracture: Secondary | ICD-10-CM | POA: Diagnosis not present

## 2019-10-05 DIAGNOSIS — S62623A Displaced fracture of medial phalanx of left middle finger, initial encounter for closed fracture: Secondary | ICD-10-CM | POA: Diagnosis not present

## 2019-10-05 DIAGNOSIS — S62629A Displaced fracture of medial phalanx of unspecified finger, initial encounter for closed fracture: Secondary | ICD-10-CM

## 2019-10-05 NOTE — ED Triage Notes (Addendum)
Patient c/o swelling and pain in her left 5th finger for a week.  Patient denies any injury or fall.

## 2019-10-05 NOTE — Discharge Instructions (Signed)
Follow up with orthopedist next week °

## 2019-10-05 NOTE — ED Provider Notes (Signed)
[facility]    CSN: [id number] Arrival date & time: 10/05/19  6659      History   Chief Complaint Chief Complaint  Patient presents with  . Finger pain    left 5th finger    HPI Rachel Montes is a 33 y.o. female.   33 yo female with a c/o swelling and pain to her left pinky finger for one week after injuring it while cracking her knuckles. Denies any fall or other traumatic injury. Denies any fevers, chills, numbness/tingling.      Past Medical History:  Diagnosis Date  . Family history of breast cancer   . Family history of ovarian cancer    genetic testing letter sent 9/20  . GBS bacteriuria 10/01/2017  . History of abnormal cervical Papanicolaou smear   . 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 Postpartum care following vaginal delivery 04/12/2018  . Postpartum hemorrhage 04/2018  . Shoulder dystocia during labor and delivery 04/2018  . SPROM (prolonged spontaneous rupture of membranes) 04/10/2018    Patient Active Problem List   Diagnosis Date Noted  . PPH (postpartum hemorrhage) 07/06/2019  . Family history of ovarian cancer 07/06/2019  . Family history of breast cancer in mother 07/06/2019  . Shoulder dystocia during labor and delivery, delivered 04/12/2018  . Rh negative state in antepartum period 12/27/2017  . 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 COLPOSCOPY  12/2017  . LEEP      OB History    Gravida  3   Para  2   Term  2   Preterm      AB  1   Living  2     SAB      TAB  1   Ectopic      Multiple  0   Live Births  2            Home Medications    Prior to Admission medications   Medication Sig Start Date End Date Taking? Authorizing Provider  CAMILA 0.35 MG tablet TAKE 1 TABLET BY MOUTH EVERY DAY 09/04/19   Farrel Conners, CNM    Family History Family History  Problem Relation Age of Onset  . Breast  cancer Mother 31  . Ovarian cancer Paternal Grandmother 73  . Ovarian cancer Paternal Aunt 75       has contact  . Cervical cancer Maternal Grandmother     Social History Social History   Tobacco Use  . Smoking status: Never Smoker  . Smokeless tobacco: Never Used  Substance Use Topics  . Alcohol use: Not Currently  . Drug use: No     Allergies   Patient has no known allergies.   Review of Systems Review of Systems   Physical Exam Triage Vital Signs ED Triage Vitals  Enc Vitals Group     BP 10/05/19 1014 109/78     Pulse Rate 10/05/19 1014 64     Resp 10/05/19 1014 14     Temp 10/05/19 1014 98.1 F (36.7 C)     Temp Source 10/05/19 1014 Oral     SpO2 10/05/19 1014 100 %     Weight 10/05/19 1011 159 lb (72.1 kg)     Height 10/05/19 1011 5\' 7"  (1.702 m)     Head Circumference --      Peak Flow --      Pain  Score 10/05/19 1011 6     Pain Loc --      Pain Edu? --      Excl. in [place]? --    No data found.  Updated Vital Signs BP 109/78 (BP Location: Right Arm)   Pulse 64   Temp 98.1 F (36.7 C) (Oral)   Resp 14   Ht 5\' 7"  (1.702 m)   Wt 72.1 kg   SpO2 100%   Breastfeeding Yes   BMI 24.90 kg/m   Visual Acuity Right Eye Distance:   Left Eye Distance:   Bilateral Distance:    Right Eye Near:   Left Eye Near:    Bilateral Near:     Physical Exam Vitals signs and nursing note reviewed.  Constitutional:      General: She is not in acute distress.    Appearance: She is not toxic-appearing or diaphoretic.  Musculoskeletal:     Left hand: She exhibits decreased range of motion and bony tenderness (at PIP joint of 5th (little) finger). She exhibits normal two-point discrimination, normal capillary refill, no deformity, no laceration and no swelling. Normal sensation noted. Normal strength noted.  Neurological:     Mental Status: She is alert.      UC Treatments / Results  Labs (all labs ordered are listed, but only abnormal results are displayed)  Labs Reviewed - No data to display  EKG   Radiology Dg Finger Little Left  Result Date: 10/05/2019 CLINICAL DATA:  Pain in PIP joint of left little finger EXAM: LEFT LITTLE FINGER 2+V COMPARISON:  None. FINDINGS: Small bone fragment is noted along the anterior aspect of the left little finger PIP joint which could reflect a small avulsed fragment. No subluxation or dislocation. Joint spaces maintained. IMPRESSION: Small bone fragment anterior to the left little finger PIP joint could reflect small avulsed fragment. Electronically Signed   By: Rolm Baptise M.D.   On: 10/05/2019 11:13    Procedures Procedures (including critical care time)  Medications Ordered in UC Medications - No data to display  Initial Impression / Assessment and Plan / UC Course  I have reviewed the triage vital signs and the nursing notes.  Pertinent labs & imaging results that were available during my care of the patient were reviewed by me and considered in my medical decision making (see chart for details).      Final Clinical Impressions(s) / UC Diagnoses   Final diagnoses:  Closed nondisplaced fracture of middle phalanx of left little finger, initial encounter  Closed avulsion fracture of middle phalanx of finger, initial encounter     Discharge Instructions     Follow up with orthopedist next week    ED Prescriptions    None      1. x-ray results and diagnosis reviewed with patient 2. Immobilized with finger splint 3. Recommend supportive treatment with otc analgesics prn 4. Follow up with orthopedist next week 4. Follow-up prn if symptoms worsen or don't improve  PDMP not reviewed this encounter.   Norval Gable, MD 10/05/19 [phone]

## 2019-10-09 ENCOUNTER — Other Ambulatory Visit: Payer: Self-pay | Admitting: Maternal Newborn

## 2019-10-09 ENCOUNTER — Telehealth: Payer: Self-pay

## 2019-10-09 DIAGNOSIS — N760 Acute vaginitis: Secondary | ICD-10-CM

## 2019-10-09 MED ORDER — FLUCONAZOLE 150 MG PO TABS: 150.0000 mg | ORAL_TABLET | Freq: Once | ORAL | 0 refills | Status: AC

## 2019-10-09 NOTE — Progress Notes (Signed)
Rx sent for Diflucan

## 2019-10-09 NOTE — Telephone Encounter (Signed)
Rachel Montes has advised pt take diflucan 150mg  once today and again in 72 hours if symptoms do not improve will need to be seen

## 2019-10-09 NOTE — Telephone Encounter (Signed)
Pt calling; had LEEP done ~2wks ago; for the last few days has had itching and burning.  Is this normal?  [phone]

## 2019-10-09 NOTE — Telephone Encounter (Signed)
Pt aware.

## 2019-10-09 NOTE — Telephone Encounter (Signed)
Rx sent for patient to [facility] in [place].

## 2019-10-19 ENCOUNTER — Encounter: Payer: Self-pay | Admitting: Obstetrics and Gynecology

## 2019-10-19 ENCOUNTER — Ambulatory Visit (INDEPENDENT_AMBULATORY_CARE_PROVIDER_SITE_OTHER): Payer: Medicaid Other | Admitting: Obstetrics and Gynecology

## 2019-10-19 ENCOUNTER — Other Ambulatory Visit: Payer: Self-pay

## 2019-10-19 VITALS — BP 100/60 | Ht 67.0 in | Wt 162.0 lb

## 2019-10-19 DIAGNOSIS — N870 Mild cervical dysplasia: Secondary | ICD-10-CM

## 2019-10-19 DIAGNOSIS — Z9889 Other specified postprocedural states: Secondary | ICD-10-CM

## 2019-10-19 NOTE — Progress Notes (Signed)
  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  Subjective: Patient reports marked improvement in her preop symptoms. Eating a regular diet without difficulty. Pain is controlled without any medications.  Activity: normal activities of daily living. Patient reports additional symptom's since surgery of None.  Objective: BP 100/60   Ht 5\' 7"  (1.702 m)   Wt 162 lb (73.5 kg)   Breastfeeding Yes   BMI 25.37 kg/m  Physical Exam Constitutional:      Appearance: She is well-developed.  Genitourinary:     Vagina and uterus normal.     No lesions in the vagina.     No cervical motion tenderness.     No right or left adnexal mass present.     Genitourinary Comments: Well healed cervix  HENT:     Head: Normocephalic and atraumatic.  Neck:     Musculoskeletal: Neck supple.     Thyroid: No thyromegaly.  Cardiovascular:     Rate and Rhythm: Normal rate and regular rhythm.     Heart sounds: Normal heart sounds.  Pulmonary:     Effort: Pulmonary effort is normal.     Breath sounds: Normal breath sounds.  Chest:     Breasts:        Right: No inverted nipple, mass, nipple discharge or skin change.        Left: No inverted nipple, mass, nipple discharge or skin change.  Abdominal:     General: Bowel sounds are normal. There is no distension.     Palpations: Abdomen is soft. There is no mass.  Neurological:     Mental Status: She is alert and oriented to person, place, and time.  Skin:    General: Skin is warm and dry.  Psychiatric:        Behavior: Behavior normal.        Thought Content: Thought content normal.        Judgment: Judgment normal.  Vitals signs reviewed.     Assessment: s/p :  LEEP stable  Plan: Patient has done well after surgery with no apparent complications.  I have discussed the post-operative course to date, and the expected progress moving forward.  The patient understands what complications to be concerned about.  I  will see the patient in routine follow up, or sooner if needed.    Activity plan: No restriction. Pelvic rest.  Cervix is well healed Follow up in 1 year for a pap smear   R  10/19/2019, 4:36 PM

## 2019-10-23 ENCOUNTER — Ambulatory Visit
Admission: EM | Admit: 2019-10-23 | Discharge: 2019-10-23 | Disposition: A | Discharge status: Home / Self Care | Payer: Medicaid Other

## 2019-10-23 ENCOUNTER — Other Ambulatory Visit: Payer: Self-pay

## 2019-10-23 DIAGNOSIS — S62657D Nondisplaced fracture of medial phalanx of left little finger, subsequent encounter for fracture with routine healing: Secondary | ICD-10-CM | POA: Diagnosis not present

## 2019-10-23 NOTE — ED Triage Notes (Signed)
Patient reports breaking her left pinky finger 2 weeks ago, xray and exam were completed at [facility]. Patient states she is still having pain in the left finger. Patient reports swelling has increased as well as pain, states that she hits the finger constantly. Unable to bend, states this is new.

## 2019-10-23 NOTE — Discharge Instructions (Addendum)
Take ibuprofen as needed for the discomfort and swelling.    Wear the finger splint for protection.    Rest and elevate your finger.  Apply ice packs 2-3 times each day for 15 to 20 minutes.    Call the orthopedist listed below or your orthopedist to schedule an appointment for evaluation.

## 2019-10-23 NOTE — ED Provider Notes (Signed)
[facility]    CSN: [id number] Arrival date & time: 10/23/19  1543      History   Chief Complaint Chief Complaint  Patient presents with  . Finger Injury    HPI Rachel Montes is a 33 y.o. female.   Patient presents with ongoing pain and swelling in her left little finger x2 weeks.  She was seen at [facility] on 10/05/2019 and diagnosed with a closed nondisplaced fracture of the left little finger.  She was instructed to follow-up with orthopedics but states she was unable to afford this.  She has been treating her finger at home with Tylenol, buddy taping, and a finger splint.  She reports decreased ROM due to pain and swelling but denies numbness or weakness.   The history is provided by the patient.    Past Medical History:  Diagnosis Date  . Family history of breast cancer   . Family history of ovarian cancer    genetic testing letter sent 9/20  . GBS bacteriuria 10/01/2017  . History of abnormal cervical Papanicolaou smear   . 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 Postpartum care following vaginal delivery 04/12/2018  . Postpartum hemorrhage 04/2018  . Shoulder dystocia during labor and delivery 04/2018  . SPROM (prolonged spontaneous rupture of membranes) 04/10/2018    Patient Active Problem List   Diagnosis Date Noted  . PPH (postpartum hemorrhage) 07/06/2019  . Family history of ovarian cancer 07/06/2019  . Family history of breast cancer in mother 07/06/2019  . Shoulder dystocia during labor and delivery, delivered 04/12/2018  . Rh negative state in antepartum period 12/27/2017  . 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 COLPOSCOPY  12/2017  . LEEP      OB History    Gravida  3   Para  2   Term  2   Preterm      AB  1   Living  2     SAB      TAB  1   Ectopic      Multiple  0   Live Births  2            Home  Medications    Prior to Admission medications   Medication Sig Start Date End Date Taking? Authorizing Provider  bifidobacterium infantis (ALIGN) capsule  03/02/10   [provider]  CAMILA 0.35 MG tablet TAKE 1 TABLET BY MOUTH EVERY DAY 09/04/19   Farrel Conners, CNM    Family History Family History  Problem Relation Age of Onset  . Breast cancer Mother 56  . Ovarian cancer Paternal Grandmother 11  . Ovarian cancer Paternal Aunt 19       has contact  . Cervical cancer Maternal Grandmother     Social History Social History   Tobacco Use  . Smoking status: Never Smoker  . Smokeless tobacco: Never Used  Substance Use Topics  . Alcohol use: Not Currently  . Drug use: No     Allergies   Patient has no known allergies.   Review of Systems Review of Systems  Constitutional: Negative for chills and fever.  HENT: Negative for ear pain and sore throat.   Eyes: Negative for pain and visual disturbance.  Respiratory: Negative for cough and shortness of breath.   Cardiovascular: Negative for chest pain and palpitations.  Gastrointestinal: Negative for abdominal pain and  vomiting.  Genitourinary: Negative for dysuria and hematuria.  Musculoskeletal: Positive for arthralgias and joint swelling. Negative for back pain.  Skin: Negative for color change and rash.  Neurological: Negative for seizures, syncope, weakness and numbness.  All other systems reviewed and are negative.    Physical Exam Triage Vital Signs ED Triage Vitals  Enc Vitals Group     BP      Pulse      Resp      Temp      Temp src      SpO2      Weight      Height      Head Circumference      Peak Flow      Pain Score      Pain Loc      Pain Edu?      Excl. in [place]?    No data found.  Updated Vital Signs BP 116/78 (BP Location: Right Arm)   Pulse 83   Temp 98.6 F (37 C) (Oral)   Resp 16   SpO2 98%   Breastfeeding Yes   Visual Acuity Right Eye Distance:   Left Eye Distance:    Bilateral Distance:    Right Eye Near:   Left Eye Near:    Bilateral Near:     Physical Exam Vitals signs and nursing note reviewed.  Constitutional:      General: She is not in acute distress.    Appearance: She is well-developed.  HENT:     Head: Normocephalic and atraumatic.  Eyes:     Conjunctiva/sclera: Conjunctivae normal.  Neck:     Musculoskeletal: Neck supple.  Cardiovascular:     Rate and Rhythm: Normal rate and regular rhythm.     Heart sounds: No murmur.  Pulmonary:     Effort: Pulmonary effort is normal. No respiratory distress.     Breath sounds: Normal breath sounds.  Abdominal:     Palpations: Abdomen is soft.     Tenderness: There is no abdominal tenderness.  Musculoskeletal:        General: Swelling and tenderness present.     Comments: Left little finger edematous and ecchymotic.  No wounds. Tender at PIP joint.  ROM limited by discomfort.    Skin:    General: Skin is warm and dry.     Capillary Refill: Capillary refill takes less than 2 seconds.     Findings: Bruising present. No lesion.  Neurological:     General: No focal deficit present.     Mental Status: She is alert and oriented to person, place, and time.     Sensory: No sensory deficit.     Motor: No weakness.      UC Treatments / Results  Labs (all labs ordered are listed, but only abnormal results are displayed) Labs Reviewed - No data to display  EKG   Radiology No results found.  Procedures Procedures (including critical care time)  Medications Ordered in UC Medications - No data to display  Initial Impression / Assessment and Plan / UC Course  I have reviewed the triage vital signs and the nursing notes.  Pertinent labs & imaging results that were available during my care of the patient were reviewed by me and considered in my medical decision making (see chart for details).    Closed nondisplaced fracture of the middle phalanx of the left little finger.  Discussed  with patient that she can take ibuprofen as needed for the  discomfort and swelling.  Instructed her to rest and elevate her finger.  Instructed her to continue to wear the finger splint for protection.  Instructed her to call the orthopedist for the soonest available appointment.  Patient agrees to plan of care.     Final Clinical Impressions(s) / UC Diagnoses   Final diagnoses:  Closed nondisplaced fracture of middle phalanx of left little finger with routine healing, subsequent encounter     Discharge Instructions     Take ibuprofen as needed for the discomfort and swelling.    Wear the finger splint for protection.    Rest and elevate your finger.  Apply ice packs 2-3 times each day for 15 to 20 minutes.    Call the orthopedist listed below or your orthopedist to schedule an appointment for evaluation.          ED Prescriptions    None     PDMP not reviewed this encounter.   Sharion Balloon, NP 10/23/19 [phone]

## 2019-11-04 ENCOUNTER — Emergency Department: Payer: Medicaid Other

## 2019-11-04 ENCOUNTER — Other Ambulatory Visit: Payer: Self-pay

## 2019-11-04 ENCOUNTER — Emergency Department
Admission: EM | Admit: 2019-11-04 | Discharge: 2019-11-04 | Disposition: A | Discharge status: Home / Self Care | Payer: Medicaid Other | Attending: Emergency Medicine | Admitting: Emergency Medicine

## 2019-11-04 DIAGNOSIS — S62647G Nondisplaced fracture of proximal phalanx of left little finger, subsequent encounter for fracture with delayed healing: Secondary | ICD-10-CM | POA: Diagnosis not present

## 2019-11-04 DIAGNOSIS — T148XXD Other injury of unspecified body region, subsequent encounter: Secondary | ICD-10-CM | POA: Diagnosis not present

## 2019-11-04 DIAGNOSIS — S62647D Nondisplaced fracture of proximal phalanx of left little finger, subsequent encounter for fracture with routine healing: Secondary | ICD-10-CM | POA: Diagnosis not present

## 2019-11-04 DIAGNOSIS — G8929 Other chronic pain: Secondary | ICD-10-CM | POA: Diagnosis not present

## 2019-11-04 DIAGNOSIS — M79645 Pain in left finger(s): Secondary | ICD-10-CM | POA: Diagnosis not present

## 2019-11-04 DIAGNOSIS — M25552 Pain in left hip: Secondary | ICD-10-CM | POA: Diagnosis not present

## 2019-11-04 DIAGNOSIS — M25559 Pain in unspecified hip: Secondary | ICD-10-CM

## 2019-11-04 DIAGNOSIS — S6992XD Unspecified injury of left wrist, hand and finger(s), subsequent encounter: Secondary | ICD-10-CM | POA: Diagnosis present

## 2019-11-04 DIAGNOSIS — T148XXA Other injury of unspecified body region, initial encounter: Secondary | ICD-10-CM

## 2019-11-04 DIAGNOSIS — X500XXD Overexertion from strenuous movement or load, subsequent encounter: Secondary | ICD-10-CM | POA: Diagnosis not present

## 2019-11-04 DIAGNOSIS — Z79899 Other long term (current) drug therapy: Secondary | ICD-10-CM | POA: Diagnosis not present

## 2019-11-04 NOTE — ED Triage Notes (Signed)
PT arrived via POV with reports of left leg pain for several months, and left pinky finger pain x 2 months.   Pt states the pain in her leg is throbbing and painful to walk.

## 2019-11-04 NOTE — ED Provider Notes (Signed)
[facility] Emergency Department Provider Note  ____________________________________________  Time seen: Approximately 5:48 PM  I have reviewed the triage vital signs and the nursing notes.   HISTORY  Chief Complaint Leg Pain and Hand Pain    HPI Rachel Montes is a 33 y.o. female that presents to the emergency department for evaluation of left little finger pain after fracture 1 month ago and left hip pain for 6 months.  Patient states that she went to urgent care in October after cracking her knuckles and immediately feeling pain to her left little finger.  She is unable to bend her finger.  She was diagnosed with a small fracture.  She was unable to afford to follow-up with orthopedics so has been trying to "treat herself." She usually wears her finger splint but states that sometimes it feels like it makes it worse so she takes it off.  Her daughter has been pulling on her finger and she states that she frequently bumps it on something around the house during times when she is not wearing the splint.  It does not seem to be healing.  Patient also states that she has had left hip pain for about 6 months.  Pain radiates into the outside of her left thigh.  Patient carries her child on her left hip.  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  Patient states that she has been trying exercises to alleviate discomfort.  Occasionally, after she has been sitting for a very long time she will get some numbness.  She does have intermittent low back pain.  She has not been able to follow-up with anyone regarding this.  No bowel or bladder dysfunction or saddle anesthesias.  No dysuria or urinary symptoms.  No fever.  Patient takes oral contraceptives.  She does not smoke.  She had a LEEP procedure done about 2 months ago.  She also traveled to [place] 2 months ago. No specific trauma.  No weakness.  Patient is breast-feeding.    Past Medical History:   Diagnosis Date  . Family history of breast cancer   . Family history of ovarian cancer    genetic testing letter sent 9/20  . GBS bacteriuria 10/01/2017  . History of abnormal cervical Papanicolaou smear   . 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 Postpartum care following vaginal delivery 04/12/2018  . Postpartum hemorrhage 04/2018  . Shoulder dystocia during labor and delivery 04/2018  . SPROM (prolonged spontaneous rupture of membranes) 04/10/2018    Patient Active Problem List   Diagnosis Date Noted  . PPH (postpartum hemorrhage) 07/06/2019  . Family history of ovarian cancer 07/06/2019  . Family history of breast cancer in mother 07/06/2019  . Shoulder dystocia during labor and delivery, delivered 04/12/2018  . Rh negative state in antepartum period 12/27/2017  . 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 COLPOSCOPY  12/2017  . LEEP      Prior to Admission medications   Medication Sig Start Date End Date Taking? Authorizing Provider  bifidobacterium infantis (ALIGN) capsule  03/02/10   [provider]  CAMILA 0.35 MG tablet TAKE 1 TABLET BY MOUTH EVERY DAY 09/04/19   Dalia Heading, CNM    Allergies Patient has no known allergies.  Family History  Problem Relation Age of Onset  . Breast cancer Mother 58  . Ovarian cancer Paternal Grandmother 27  .  Ovarian cancer Paternal Aunt [id number]       has contact  . Cervical cancer Maternal Grandmother     Social History Social History   Tobacco Use  . Smoking status: Never Smoker  . Smokeless tobacco: Never Used  Substance Use Topics  . Alcohol use: Not Currently  . Drug use: No     Review of Systems  Constitutional: No fever/chills Cardiovascular: No chest pain. Respiratory: No SOB. Gastrointestinal: No abdominal pain.  No nausea, no vomiting.  Genitourinary: Negative for dysuria. Musculoskeletal: Positive for  finger and hip pain. Skin: Negative for rash, abrasions, lacerations.  Positive for ecchymosis to left little finger. Neurological: Negative for tingling   ____________________________________________   PHYSICAL EXAM:  VITAL SIGNS: ED Triage Vitals  Enc Vitals Group     BP 11/04/19 1732 123/81     Pulse Rate 11/04/19 1732 93     Resp 11/04/19 1732 18     Temp 11/04/19 1732 98.1 F (36.7 C)     Temp Source 11/04/19 1732 Oral     SpO2 11/04/19 1732 97 %     Weight 11/04/19 1730 164 lb (74.4 kg)     Height 11/04/19 1730 5\' 7"  (1.702 m)     Head Circumference --      Peak Flow --      Pain Score 11/04/19 1730 5     Pain Loc --      Pain Edu? --      Excl. in GC? --      Constitutional: Alert and oriented. Well appearing and in no acute distress. Eyes: Conjunctivae are normal. PERRL. EOMI. Head: Atraumatic. ENT:      Ears:      Nose: No congestion/rhinnorhea.      Mouth/Throat: Mucous membranes are moist.  Neck: No stridor.  Cardiovascular: Normal rate, regular rhythm.  Good peripheral circulation.  Symmetric radial pulses bilaterally.  Cap refill less than 3 seconds. Respiratory: Normal respiratory effort without tachypnea or retractions. Lungs CTAB. Good air entry to the bases with no decreased or absent breath sounds. Gastrointestinal: Bowel sounds 4 quadrants. Soft and nontender to palpation. No guarding or rigidity. No palpable masses. No distention.  Musculoskeletal: No gross deformities appreciated.  Mild tenderness to palpation to left trochanteric bursa.  Full range of motion of left hip with full external and internal rotation.  Strength equal in lower extremities bilaterally. No leg or ankle swelling. No foot drop.  Limited flexion of left little finger.  No finger splint in place. Neurologic:  Normal speech and language. No gross focal neurologic deficits are appreciated.  Skin:  Skin is warm, dry and intact.  Ecchymosis to left little finger at the PIP  joint. Psychiatric: Mood and affect are normal. Speech and behavior are normal. Patient exhibits appropriate insight and judgement.   ____________________________________________   LABS (all labs ordered are listed, but only abnormal results are displayed)  Labs Reviewed - No data to display ____________________________________________  EKG   ____________________________________________  RADIOLOGY Lexine BatonI, Staci Carver, personally viewed and evaluated these images (plain radiographs) as part of my medical decision making, as well as reviewing the written report by the radiologist.  Dg Finger Little Left  Result Date: 11/04/2019 CLINICAL DATA:  Fracture 1 month ago with continued pain. EXAM: LEFT LITTLE FINGER 2+V COMPARISON:  None. FINDINGS: Apparent soft tissue swelling. No dislocation. The small soft tissue calcification adjacent to the proximal interphalangeal joint persists but is a little less conspicuous in the interval. No other  acute abnormalities. IMPRESSION: The soft tissue calcification adjacent to the fifth proximal interphalangeal joint persists. It is a little less conspicuous in the interval which could be technical in nature. This raises the possibility of an avulsed fracture fragment/volar plate injury. Electronically Signed   By: Gerome Sam III M.D   On: 11/04/2019 18:31    ____________________________________________    PROCEDURES  Procedure(s) performed:    Procedures    Medications - No data to display   ____________________________________________   INITIAL IMPRESSION / ASSESSMENT AND PLAN / ED COURSE  Pertinent labs & imaging results that were available during my care of the patient were reviewed by me and considered in my medical decision making (see chart for details).  Review of the [place] CSRS was performed in accordance of the [facility] prior to dispensing any controlled drugs.   Patient's diagnosis is consistent with closed nondisplaced  fracture of the proximal phalanx of the left little finger with delayed healing and chronic hip pain.  Vital signs and exam are reassuring.  X-ray is consistent with fracture.  Patient is breast-feeding and will continue Tylenol.  She was encouraged to wear her finger splint.  Hip pain has been chronic without trauma.  Low suspicion for DVT or emergent back pathology. Patient is to follow up with orthopedics as directed. Patient is given ED precautions to return to the ED for any worsening or new symptoms.   Nou Goetzke was evaluated in Emergency Department on 11/04/2019 for the symptoms described in the history of present illness. She was evaluated in the context of the global COVID-19 pandemic, which necessitated consideration that the patient might be at risk for infection with the SARS-CoV-2 virus that causes COVID-19. Institutional protocols and algorithms that pertain to the evaluation of patients at risk for COVID-19 are in a state of rapid change based on information released by regulatory bodies including the CDC and federal and state organizations. These policies and algorithms were followed during the patient's care in the ED.  ____________________________________________  FINAL CLINICAL IMPRESSION(S) / ED DIAGNOSES  Final diagnoses:  Avulsion fracture  Hip pain  Closed nondisplaced fracture of proximal phalanx of left little finger with delayed healing, subsequent encounter      NEW MEDICATIONS STARTED DURING THIS VISIT:  ED Discharge Orders    None          This chart was dictated using voice recognition software/Dragon. Despite best efforts to proofread, errors can occur which can change the meaning. Any change was purely unintentional.    Enid Derry, PA-C 11/04/19 2243    Jene Every, MD 11/04/19 2249

## 2019-11-04 NOTE — ED Triage Notes (Signed)
First RN Note: Pt presents to ED via POV with c/o worsening finger L pinky finger pain. Pt also c/o L leg pain x months.   Pt presents with her small daughter, states there is no one to come and get her.

## 2019-11-04 NOTE — Discharge Instructions (Addendum)
You still have a fracture to your left little finger.  Please continue to wear your finger splint.  Please follow-up with orthopedics or the hand surgeon as soon as possible for reevaluation.

## 2020-01-03 ENCOUNTER — Encounter: Payer: Self-pay | Admitting: Licensed Clinical Social Worker

## 2020-01-03 ENCOUNTER — Inpatient Hospital Stay: Payer: Medicaid Other | Attending: Oncology | Admitting: Licensed Clinical Social Worker

## 2020-01-03 DIAGNOSIS — Z1379 Encounter for other screening for genetic and chromosomal anomalies: Secondary | ICD-10-CM

## 2020-01-03 DIAGNOSIS — Z800 Family history of malignant neoplasm of digestive organs: Secondary | ICD-10-CM | POA: Diagnosis not present

## 2020-01-03 DIAGNOSIS — Z803 Family history of malignant neoplasm of breast: Secondary | ICD-10-CM | POA: Diagnosis not present

## 2020-01-03 DIAGNOSIS — Z8041 Family history of malignant neoplasm of ovary: Secondary | ICD-10-CM

## 2020-01-03 NOTE — Progress Notes (Signed)
REFERRING PROVIDER: Lloyd Huger, MD [address] [place],  [place] [address]  PRIMARY PROVIDER:  Dalia Heading, CNM  PRIMARY REASON FOR VISIT:  1. Family history of ovarian cancer   2. Family history of breast cancer   3. Family history of throat cancer     I connected with Rachel Montes on 01/03/2020 at 2:30 PM EDT by MyChart video and verified that I am speaking with the correct person using two identifiers.    Patient location: home  Provider location: clinic  HISTORY OF PRESENT ILLNESS:   Rachel Montes, a 34 y.o. female, was seen for a [place] cancer genetics consultation at the request of Dr. Grayland Ormond due to a family history of cancer.  Rachel Montes presents to clinic today to discuss the possibility of a hereditary predisposition to cancer, genetic testing, and to further clarify her future cancer risks, as well as potential cancer risks for family members.   Rachel Montes is a 34 y.o. female with no personal history of cancer.    CANCER HISTORY:  Oncology History   No history exists.     RISK FACTORS:  Menarche was at age 34.  First live birth at age 4.  OCP use for approximately 10 years.  Ovaries intact: yes.  Hysterectomy: no.  Menopausal status: premenopausal.  HRT use: 0 years. Colonoscopy: no; not examined. Mammogram within the last year: no. Number of breast biopsies: 0. Up to date with pelvic exams: yes. Any excessive radiation exposure in the past: no  Past Medical History:  Diagnosis Date  . Family history of breast cancer   . Family history of breast cancer   . Family history of ovarian cancer    genetic testing letter sent 9/20  . Family history of ovarian cancer   . Family history of throat cancer   . GBS bacteriuria 10/01/2017  . History of abnormal cervical Papanicolaou smear   . 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 Postpartum care following vaginal delivery 04/12/2018  . Postpartum hemorrhage 04/2018  . Shoulder  dystocia during labor and delivery 04/2018  . SPROM (prolonged spontaneous rupture of membranes) 04/10/2018    Past Surgical History:  Procedure Laterality Date  . COLPOSCOPY  12/2017  . LEEP      Social History   Socioeconomic History  . Marital status: Married    Spouse name: Not on file  . Number of children: 2  . Years of education: Not on file  . Highest education level: Not on file  Occupational History  . Not on file  Tobacco Use  . Smoking status: Never Smoker  . Smokeless tobacco: Never Used  Substance and Sexual Activity  . Alcohol use: Not Currently  . Drug use: No  . Sexual activity: Yes    Birth control/protection: Pill  Other Topics Concern  . Not on file  Social History Narrative  . Not on file   Social Determinants of Health   Financial Resource Strain:   . Difficulty of Paying Living Expenses: Not on file  Food Insecurity:   . Worried About Charity fundraiser in the Last Year: Not on file  . Ran Out of Food in the Last Year: Not on file  Transportation Needs:   . Lack of Transportation (Medical): Not on file  . Lack of Transportation (Non-Medical): Not on file  Physical Activity:   . Days of Exercise per Week: Not on file  . Minutes of Exercise per Session: Not  on file  Stress:   . Feeling of Stress : Not on file  Social Connections:   . Frequency of Communication with Friends and Family: Not on file  . Frequency of Social Gatherings with Friends and Family: Not on file  . Attends Religious Services: Not on file  . Active Member of Clubs or Organizations: Not on file  . Attends Archivist Meetings: Not on file  . Marital Status: Not on file     FAMILY HISTORY:  We obtained a detailed, 4-generation family history.  Significant diagnoses are listed below: Family History  Problem Relation Age of Onset  . Breast cancer Mother 1  . Ovarian cancer Paternal Grandmother 56  . Cervical cancer Paternal Aunt 35       has contact  . Skin  cancer Father   . Cervical cancer Maternal Grandmother        dx 8s  . Throat cancer Maternal Grandfather   . Throat cancer Paternal Grandfather    Rachel Montes has 2 daughters, ages 25 and 19 months, no cancers. She has 4 brothers, no cancers.   Rachel Montes mother had breast cancer at 18 and is living at 3. She has not had genetic testing. Patient has 1 maternal aunt, no cancers. She has 3 maternal cousins, no cancers. Maternal grandmother had cervical cancer in her 40s and died at 2. Maternal grandfather had throat cancer and history of smoking and died at 1.  Rachel Montes father has had skin cancer that was removed, he is living at 44. Patient has 2 paternal aunts, 2 paternal uncles. One of her aunts has cervical cancer that was diagnosed in her 50s, she is in her 12s. No known cancers in paternal cousins. Paternal grandmother had ovarian cancer in her 54s and died in her 39s. Paternal grandfather had throat or lugn cancer, history of smoking. He had siblings who also had lung/throat cancers.   Rachel Montes is unaware of previous family history of genetic testing for hereditary cancer risks. Patient's maternal ancestors are of [place] and [place] descent, and paternal ancestors are of [place] and [place] descent. There is no reported Ashkenazi Jewish ancestry. There is no known consanguinity.  GENETIC COUNSELING ASSESSMENT: Rachel Montes is a 34 y.o. female with a family history which is somewhat suggestive of a hereditary cancer syndrome and predisposition to cancer. We, therefore, discussed and recommended the following at today's visit.   DISCUSSION: We discussed that 5 - 10% of breast cancer is hereditary and that 15-25% of ovarian cancer is hereditary, with most cases associated with BRCA1/BRCA2 mutations.  There are other genes that can be associated with hereditary breast and ovarian cancer syndromes. We discussed that testing is beneficial for several reasons including knowing  how to follow individuals for cancer screenings and understand if other family members could be at risk for cancer and allow them to undergo genetic testing.   We reviewed the characteristics, features and inheritance patterns of hereditary cancer syndromes. We also discussed genetic testing, including the appropriate family members to test, the process of testing, insurance coverage and turn-around-time for results. We discussed the implications of a negative, positive and/or variant of uncertain significant result. We recommended Rachel Montes pursue genetic testing for the [facility] Common Hereditary Cancers gene panel.   The Common Hereditary Cancers Panel offered by [facility] includes sequencing and/or deletion duplication testing of the following 48 genes: APC, ATM, AXIN2, BARD1, BMPR1A, BRCA1, BRCA2, BRIP1, CDH1, CDKN2A (p14ARF), CDKN2A (p16INK4a), CKD4, CHEK2, CTNNA1,  DICER1, EPCAM (Deletion/duplication testing only), GREM1 (promoter region deletion/duplication testing only), KIT, MEN1, MLH1, MSH2, MSH3, MSH6, MUTYH, NBN, NF1, NHTL1, PALB2, PDGFRA, PMS2, POLD1, POLE, PTEN, RAD50, RAD51C, RAD51D, RNF43, SDHB, SDHC, SDHD, SMAD4, SMARCA4. STK11, TP53, TSC1, TSC2, and VHL.  The following genes were evaluated for sequence changes only: SDHA and HOXB13 c.251G>A variant only.  Based on Rachel Montes's family history of cancer, she meets medical criteria for genetic testing. Despite that she meets criteria, she may still have an out of pocket cost.   Based on the patient's personal and  family history, a statistical model Air cabin crew) was used to estimate her risk of developing breast cancer. This estimates her lifetime risk of developing breast cancer to be approximately 23%. This estimation is in the setting of negative genetic testing and may change based on results.  The patient's lifetime breast cancer risk is a preliminary estimate based on available information using one of several models endorsed by  the [place] (ACS). The ACS recommends consideration of breast MRI screening as an adjunct to mammography for patients at high risk (defined as 20% or greater lifetime risk).    Rachel Montes is followed by Dr. Grayland Ormond for high risk for breast cancer.   PLAN: After considering the risks, benefits, and limitations, Rachel Montes provided informed consent to pursue genetic testing. A saliva kit will be mailed to her and she will send her sample to [facility] for analysis of the Common Hereditary Cancers Panel. Results should be available within approximately 2-3 weeks' time, at which point they will be disclosed by telephone to Rachel Montes, as will any additional recommendations warranted by these results. Rachel Montes will receive a summary of her genetic counseling visit and a copy of her results once available. This information will also be available in Epic.   Based on Rachel Montes's family history, we recommended her mother and paternal relatives have genetic counseling and testing. Ms. Capetillo will let us know if we can be of any assistance in coordinating genetic counseling and/or testing for this family member.   Ms. Forget questions were answered to her satisfaction today. Our contact information was provided should additional questions or concerns arise. Thank you for the referral and allowing [place] to share in the care of your patient.   Faith Rogue, MS, [facility] Genetic Counselor [email or web address] Phone: [phone]  The patient was seen for a total of 25 minutes in virtual genetic counseling.  Drs. Magrinat, Lindi Adie and/or Burr Medico were available for discussion regarding this case.   _______________________________________________________________________ For Office Staff:  Number of people involved in session: 1 Was an Intern/ student involved with case: no

## 2020-01-24 ENCOUNTER — Telehealth: Payer: Self-pay | Admitting: Licensed Clinical Social Worker

## 2020-01-24 ENCOUNTER — Ambulatory Visit: Payer: Self-pay | Admitting: Licensed Clinical Social Worker

## 2020-01-24 ENCOUNTER — Encounter: Payer: Self-pay | Admitting: Licensed Clinical Social Worker

## 2020-01-24 DIAGNOSIS — Z1379 Encounter for other screening for genetic and chromosomal anomalies: Secondary | ICD-10-CM | POA: Insufficient documentation

## 2020-01-24 DIAGNOSIS — Z800 Family history of malignant neoplasm of digestive organs: Secondary | ICD-10-CM

## 2020-01-24 DIAGNOSIS — Z803 Family history of malignant neoplasm of breast: Secondary | ICD-10-CM

## 2020-01-24 DIAGNOSIS — Z8041 Family history of malignant neoplasm of ovary: Secondary | ICD-10-CM

## 2020-01-24 NOTE — Progress Notes (Signed)
HPI:  Rachel Montes was previously seen in the [facility] due to a family history of cancer and concerns regarding a hereditary predisposition to cancer. Please refer to our prior cancer genetics clinic note for more information regarding our discussion, assessment and recommendations, at the time. Rachel Montes recent genetic test results were disclosed to her, as were recommendations warranted by these results. These results and recommendations are discussed in more detail below.  CANCER HISTORY:  Oncology History   No history exists.    FAMILY HISTORY:  We obtained a detailed, 4-generation family history.  Significant diagnoses are listed below: Family History  Problem Relation Age of Onset  . Breast cancer Mother 8  . Ovarian cancer Paternal Grandmother 64  . Cervical cancer Paternal Aunt 3       has contact  . Skin cancer Father   . Cervical cancer Maternal Grandmother        dx 19s  . Throat cancer Maternal Grandfather   . Throat cancer Paternal Grandfather     Rachel Montes has 2 daughters, ages 73 and 28 months, no cancers. She has 4 brothers, no cancers.   Rachel Montes mother had breast cancer at 52 and is living at 76. She has not had genetic testing. Patient has 1 maternal aunt, no cancers. She has 3 maternal cousins, no cancers. Maternal grandmother had cervical cancer in her 66s and died at 64. Maternal grandfather had throat cancer and history of smoking and died at 66.  Ms. Schiltz father has had skin cancer that was removed, he is living at 41. Patient has 2 paternal aunts, 2 paternal uncles. One of her aunts has cervical cancer that was diagnosed in her 66s, she is in her 14s. No known cancers in paternal cousins. Paternal grandmother had ovarian cancer in her 16s and died in her 62s. Paternal grandfather had throat or lugn cancer, history of smoking. He had siblings who also had lung/throat cancers.   Ms. File is unaware of  previous family history of genetic testing for hereditary cancer risks. Patient's maternal ancestors are of [place] and [place] descent, and paternal ancestors are of [place] and [place] descent. There is no reported Ashkenazi Jewish ancestry. There is no known consanguinity.  GENETIC TEST RESULTS: Genetic testing reported out on 01/23/2020 through the [facility] Common Hereditary cancer panel found no pathogenic mutations.  The Common Hereditary Cancers Panel offered by [facility] includes sequencing and/or deletion duplication testing of the following 48 genes: APC, ATM, AXIN2, BARD1, BMPR1A, BRCA1, BRCA2, BRIP1, CDH1, CDKN2A (p14ARF), CDKN2A (p16INK4a), CKD4, CHEK2, CTNNA1, DICER1, EPCAM (Deletion/duplication testing only), GREM1 (promoter region deletion/duplication testing only), KIT, MEN1, MLH1, MSH2, MSH3, MSH6, MUTYH, NBN, NF1, NHTL1, PALB2, PDGFRA, PMS2, POLD1, POLE, PTEN, RAD50, RAD51C, RAD51D, RNF43, SDHB, SDHC, SDHD, SMAD4, SMARCA4. STK11, TP53, TSC1, TSC2, and VHL.  The following genes were evaluated for sequence changes only: SDHA and HOXB13 c.251G>A variant only.  The test report has been scanned into EPIC and is located under the Molecular Pathology section of the Results Review tab.  A portion of the result report is included below for reference.     We discussed with Rachel Montes that because current genetic testing is not perfect, it is possible there may be a gene mutation in one of these genes that current testing cannot detect, but that chance is small.  We also discussed, that there could be another gene that has not yet been discovered, or that we have not yet tested, that  is responsible for the cancer diagnoses in the family. It is also possible there is a hereditary cause for the cancer in the family that Rachel Montes did not inherit and therefore was not identified in her testing.  Therefore, it is important to remain in touch with cancer genetics in the future so that we can continue to  offer Rachel Montes the most up to date genetic testing.    ADDITIONAL GENETIC TESTING: We discussed with Rachel Montes that her genetic testing was fairly extensive.  If there are genes identified to increase cancer risk that can be analyzed in the future, we would be happy to discuss and coordinate this testing at that time.    CANCER SCREENING RECOMMENDATIONS: Rachel Montes test result is considered negative (normal).  This means that we have not identified a hereditary cause for her family history of cancer at this time.    While reassuring, this does not definitively rule out a hereditary predisposition to cancer. It is still possible that there could be genetic mutations that are undetectable by current technology. There could be genetic mutations in genes that have not been tested or identified to increase cancer risk.  Therefore, it is recommended she continue to follow the cancer management and screening guidelines provided by her primary healthcare provider.   An individual's cancer risk and medical management are not determined by genetic test results alone. Overall cancer risk assessment incorporates additional factors, including personal medical history, family history, and any available genetic information that may result in a personalized plan for cancer prevention and surveillance.  Based on the patient's personal and  family history, a statistical model [employer]) was used to estimate her risk of developing breast cancer. This estimates her lifetime risk of developing breast cancer to be approximately 23%. This estimation is in the setting of negative genetic testing. The patient's lifetime breast cancer risk is a preliminary estimate based on available information using one of several models endorsed by the [place] (ACS). The ACS recommends consideration of breast MRI screening as an adjunct to mammography for patients at high risk (defined as 20% or greater  lifetime risk).    Rachel Montes for high risk for breast cancer.  RECOMMENDATIONS FOR FAMILY MEMBERS:  Relatives in this family might be at some increased risk of developing cancer, over the general population risk, simply due to the family history of cancer.  We recommended female relatives in this family have a yearly mammogram beginning at age 22, or 27 years younger than the earliest onset of cancer, an annual clinical breast exam, and perform monthly breast self-exams. Female relatives in this family should also have a gynecological exam as recommended by their primary provider. All family members should have a colonoscopy by age 53, or as directed by their physicians.   It is also possible there is a hereditary cause for the cancer in Ms. Kneeland's family that she did not inherit and therefore was not identified in her.  Based on Ms. Nielson's family history, we recommended her mother and father/paternal relatives have genetic counseling and testing. Ms. Estill will let us know if we can be of any assistance in coordinating genetic counseling and/or testing for these family members.  FOLLOW-UP: Lastly, we discussed with Ms. Thelin that cancer genetics is a rapidly advancing field and it is possible that new genetic tests will be appropriate for her and/or her family members in the future. We encouraged her to remain  in contact with cancer genetics on an annual basis so we can update her personal and family histories and let her know of advances in cancer genetics that may benefit this family.   Our contact number was provided. Ms. Adsit questions were answered to her satisfaction, and she knows she is welcome to call us at anytime with additional questions or concerns.   Faith Rogue, MS, [facility] Genetic Counselor [email or web address] Phone: [phone]

## 2020-01-24 NOTE — Telephone Encounter (Signed)
Revealed negative genetic testing.  This normal result is reassuring.  It is unlikely that there is an increased risk of cancer due to a mutation in one of these genes.  However, genetic testing is not perfect, and cannot definitively rule out a hereditary cause.  It will be important for her to keep in contact with genetics to learn if any additional testing may be needed in the future. Additionally, she should continue to be followed as high risk for breast cancer.

## 2020-04-10 DIAGNOSIS — Z8616 Personal history of COVID-19: Secondary | ICD-10-CM | POA: Insufficient documentation

## 2020-04-15 ENCOUNTER — Ambulatory Visit: Payer: Self-pay

## 2020-04-15 ENCOUNTER — Emergency Department: Payer: Medicaid Other

## 2020-04-15 ENCOUNTER — Other Ambulatory Visit: Payer: Self-pay

## 2020-04-15 ENCOUNTER — Encounter: Payer: Self-pay | Admitting: Emergency Medicine

## 2020-04-15 ENCOUNTER — Emergency Department
Admission: EM | Admit: 2020-04-15 | Discharge: 2020-04-15 | Disposition: A | Discharge status: Home / Self Care | Payer: Medicaid Other | Attending: Emergency Medicine | Admitting: Emergency Medicine

## 2020-04-15 DIAGNOSIS — R079 Chest pain, unspecified: Secondary | ICD-10-CM | POA: Diagnosis not present

## 2020-04-15 DIAGNOSIS — Z79899 Other long term (current) drug therapy: Secondary | ICD-10-CM | POA: Insufficient documentation

## 2020-04-15 DIAGNOSIS — M791 Myalgia, unspecified site: Secondary | ICD-10-CM

## 2020-04-15 DIAGNOSIS — U071 COVID-19: Secondary | ICD-10-CM | POA: Diagnosis not present

## 2020-04-15 DIAGNOSIS — J3489 Other specified disorders of nose and nasal sinuses: Secondary | ICD-10-CM | POA: Diagnosis present

## 2020-04-15 LAB — BASIC METABOLIC PANEL
Anion gap: 10 (ref 5–15)
BUN: 7 mg/dL (ref 6–20)
CO2: 23 mmol/L (ref 22–32)
Calcium: 8.9 mg/dL (ref 8.9–10.3)
Chloride: 105 mmol/L (ref 98–111)
Creatinine, Ser: 0.7 mg/dL (ref 0.44–1.00)
GFR calc Af Amer: >60 mL/min (ref >60)
GFR calc non Af Amer: >60 mL/min (ref >60)
Glucose, Bld: 88 mg/dL (ref 70–99)
Potassium: 3.4 mmol/L — ABNORMAL LOW (ref 3.5–5.1)
Sodium: 138 mmol/L (ref 135–145)

## 2020-04-15 LAB — CBC
HCT: 39.2 % (ref 36.0–46.0)
Hemoglobin: 13.5 g/dL (ref 12.0–15.0)
MCH: 28.8 pg (ref 26.0–34.0)
MCHC: 34.4 g/dL (ref 30.0–36.0)
MCV: 83.8 fL (ref 80.0–100.0)
Platelets: 229 10*3/uL (ref 150–400)
RBC: 4.68 MIL/uL (ref 3.87–5.11)
RDW: 13.6 % (ref 11.5–15.5)
WBC: 3.5 10*3/uL — ABNORMAL LOW (ref 4.0–10.5)
nRBC: 0 % (ref 0.0–0.2)

## 2020-04-15 LAB — TROPONIN I (HIGH SENSITIVITY): Troponin I (High Sensitivity): 3 ng/L (ref <18)

## 2020-04-15 MED ORDER — ONDANSETRON 4 MG PO TBDP: 4.0000 mg | ORAL_TABLET | Freq: Three times a day (TID) | ORAL | 0 refills | Status: DC | PRN

## 2020-04-15 MED ORDER — IBUPROFEN 600 MG PO TABS: 600.0000 mg | ORAL_TABLET | Freq: Three times a day (TID) | ORAL | 0 refills | Status: DC | PRN

## 2020-04-15 NOTE — ED Provider Notes (Signed)
[facility] Emergency Department Provider Note  ____________________________________________   First MD Initiated Contact with Patient 04/15/20 1135     (approximate)  I have reviewed the triage vital signs and the nursing notes.   HISTORY  Chief Complaint Chest Pain and Nausea    HPI Rachel Montes is a 34 y.o. female with past medical history as below here with multiple complaints.  The patient was recently exposed to coronavirus, and her husband and child have both tested positive.  Over the last several days, she has had runny nose, cough, sore throat, fevers.  She is also began to develop tingling in her left arm and subjective numbness, although this also involves bilateral legs, as well as right chest pain intermittently.  The pain is worse with movement and palpation as well as coughing.  She has had ongoing fevers.  No shortness of breath.  No sputum production.  No hemoptysis.  She is not on oral contraceptives.  No history of DVT/PE.       Past Medical History:  Diagnosis Date  . Family history of breast cancer   . Family history of breast cancer   . Family history of ovarian cancer    genetic testing letter sent 9/20  . Family history of ovarian cancer   . Family history of throat cancer   . GBS bacteriuria 10/01/2017  . History of abnormal cervical Papanicolaou smear   . 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 Postpartum care following vaginal delivery 04/12/2018  . Postpartum hemorrhage 04/2018  . Shoulder dystocia during labor and delivery 04/2018  . SPROM (prolonged spontaneous rupture of membranes) 04/10/2018    Patient Active Problem List   Diagnosis Date Noted  . Genetic testing 01/24/2020  . Family history of breast cancer   . Family history of throat cancer   . PPH (postpartum hemorrhage) 07/06/2019  . Family history of ovarian cancer 07/06/2019  . Family history of breast cancer in mother 07/06/2019  . Shoulder dystocia  during labor and delivery, delivered 04/12/2018  . Rh negative state in antepartum period 12/27/2017  . 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 COLPOSCOPY  12/2017  . LEEP      Prior to Admission medications   Medication Sig Start Date End Date Taking? Authorizing Provider  bifidobacterium infantis (ALIGN) capsule  03/02/10   [provider]  CAMILA 0.35 MG tablet TAKE 1 TABLET BY MOUTH EVERY DAY 09/04/19   Farrel Conners, CNM  ibuprofen (ADVIL) 600 MG tablet Take 1 tablet (600 mg total) by mouth every 8 (eight) hours as needed for fever or moderate pain. 04/15/20   Shaune Pollack, MD  ondansetron (ZOFRAN ODT) 4 MG disintegrating tablet Take 1 tablet (4 mg total) by mouth every 8 (eight) hours as needed for nausea or vomiting. 04/15/20   Shaune Pollack, MD    Allergies Patient has no known allergies.  Family History  Problem Relation Age of Onset  . Breast cancer Mother 17  . Ovarian cancer Paternal Grandmother 94  . Cervical cancer Paternal Aunt 21       has contact  . Skin cancer Father   . Cervical cancer Maternal Grandmother        dx 30s  . Throat cancer Maternal Grandfather   . Throat cancer Paternal Grandfather     Social History Social History   Tobacco Use  .  Smoking status: Never Smoker  . Smokeless tobacco: Never Used  Substance Use Topics  . Alcohol use: Not Currently  . Drug use: No    Review of Systems  Review of Systems  Constitutional: Positive for chills and fatigue. Negative for fever.  HENT: Negative for congestion and sore throat.   Eyes: Negative for visual disturbance.  Respiratory: Positive for cough. Negative for shortness of breath.   Cardiovascular: Negative for chest pain.  Gastrointestinal: Positive for nausea. Negative for abdominal pain, diarrhea and vomiting.  Genitourinary: Negative for flank pain.  Musculoskeletal:  Positive for arthralgias and myalgias. Negative for back pain and neck pain.  Skin: Negative for rash and wound.  Neurological: Positive for weakness and numbness.  All other systems reviewed and are negative.    ____________________________________________  PHYSICAL EXAM:      VITAL SIGNS: ED Triage Vitals  Enc Vitals Group     BP 04/15/20 0913 107/63     Pulse Rate 04/15/20 0913 84     Resp 04/15/20 0913 16     Temp 04/15/20 0913 98.4 F (36.9 C)     Temp Source 04/15/20 0913 Oral     SpO2 04/15/20 0913 100 %     Weight --      Height --      Head Circumference --      Peak Flow --      Pain Score 04/15/20 0916 8     Pain Loc --      Pain Edu? --      Excl. in [place]? --      Physical Exam Vitals and nursing note reviewed.  Constitutional:      General: She is not in acute distress.    Appearance: She is well-developed.  HENT:     Head: Normocephalic and atraumatic.  Eyes:     Conjunctiva/sclera: Conjunctivae normal.  Cardiovascular:     Rate and Rhythm: Normal rate and regular rhythm.     Heart sounds: Normal heart sounds.  Pulmonary:     Effort: Pulmonary effort is normal. No respiratory distress.     Breath sounds: No wheezing.  Abdominal:     General: There is no distension.  Musculoskeletal:     Cervical back: Neck supple.     Comments: Mild TTP throughout LUE, R shoulder, R lower chest wall. No bruising, deformity. No rash.  Skin:    General: Skin is warm.     Capillary Refill: Capillary refill takes less than 2 seconds.     Findings: No rash.  Neurological:     Mental Status: She is alert and oriented to person, place, and time.     Motor: No abnormal muscle tone.     Comments: Strength 5/5 bilateral UE. Normal sensation to light touch. CNII-XII individually tested and intact. Gait appears normal.        ____________________________________________   LABS (all labs ordered are listed, but only abnormal results are displayed)  Labs Reviewed    BASIC METABOLIC PANEL - Abnormal; Notable for the following components:      Result Value   Potassium 3.4 (*)    All other components within normal limits  CBC - Abnormal; Notable for the following components:   WBC 3.5 (*)    All other components within normal limits  SARS CORONAVIRUS 2 (TAT 6-24 HRS)  TROPONIN I (HIGH SENSITIVITY)    ____________________________________________  EKG: Normal sinus rhythm, ventricular rate 85.  PR 124, QRS 80, QTc 418. ________________________________________  RADIOLOGY All imaging, including plain films, CT scans, and ultrasounds, independently reviewed by me, and interpretations confirmed via formal radiology reads.  ED MD interpretation:   CXR: Clear  Official radiology report(s): DG Chest 2 View  Result Date: 04/15/2020 CLINICAL DATA:  Chest pain. EXAM: CHEST - 2 VIEW COMPARISON:  Chest x-ray dated March 20, 2019. FINDINGS: The heart size and mediastinal contours are within normal limits. Both lungs are clear. The visualized skeletal structures are unremarkable. IMPRESSION: No active cardiopulmonary disease. Electronically Signed   By: Obie Dredge M.D.   On: 04/15/2020 09:50    ____________________________________________  PROCEDURES   Procedure(s) performed (including Critical Care):  Procedures  ____________________________________________  INITIAL IMPRESSION / MDM / ASSESSMENT AND PLAN / ED COURSE  As part of my medical decision making, I reviewed the following data within the electronic MEDICAL RECORD NUMBER Nursing notes reviewed and incorporated, Old chart reviewed, Notes from prior ED visits, and [place] Controlled Substance Database       *Rachel Montes was evaluated in Emergency Department on 04/15/2020 for the symptoms described in the history of present illness. She was evaluated in the context of the global COVID-19 pandemic, which necessitated consideration that the patient might be at risk for infection with the  SARS-CoV-2 virus that causes COVID-19. Institutional protocols and algorithms that pertain to the evaluation of patients at risk for COVID-19 are in a state of rapid change based on information released by regulatory bodies including the CDC and federal and state organizations. These policies and algorithms were followed during the patient's care in the ED.  Some ED evaluations and interventions may be delayed as a result of limited staffing during the pandemic.*     Medical Decision Making:  Very pleasant 34 yo F here with diffuse body aches, myalgias, fatigue, chest pain in setting of known COVID exposures. Afebrile, non-toxic on exam here. CXR is clear without PNA. Lab work reviewed and is overall reassuring, with normal WBC, normal lytes. Trop neg despite sx >8 hours - doubt ACS. She is not tachycardic, tachypneic, or on any high risk meds and is PERC neg - doubt PE. She has a normal neuro exam with no focal deficits, and her "tingling" sounds more so like paresthesias vs myalgias likely 2/2 her viral illness. No evidence of rhabdo on labs or clinically. Renal function is normal. No hematuria clinically.  Suspect viral syndrome likely 2/2 COVID-19. No signs to suggest complication such as stroke, DVT/PE, ACS. Will encourage supportive care and fluids. Given absence of hypoxia or CXR findings, do not feel steroids indicated and pt is breast feeding. Return precautions given.  ____________________________________________  FINAL CLINICAL IMPRESSION(S) / ED DIAGNOSES  Final diagnoses:  COVID-19  Myalgia     MEDICATIONS GIVEN DURING THIS VISIT:  Medications - No data to display   ED Discharge Orders         Ordered    ibuprofen (ADVIL) 600 MG tablet  Every 8 hours PRN     04/15/20 1231    ondansetron (ZOFRAN ODT) 4 MG disintegrating tablet  Every 8 hours PRN     04/15/20 1231           Note:  This document was prepared using Dragon voice recognition software and may include  unintentional dictation errors.   Shaune Pollack, MD 04/15/20 [phone]

## 2020-04-15 NOTE — Discharge Instructions (Addendum)
Continue to drink plenty of fluids. I'd recommend Gatorade or other stronger tasting beverage to encourage hydration.  Take motrin as needed for pain.  You can take tylenol 1000 mg every 6 hours as well if needed.  Body aches, pains, and tingling are normal with COVID. If symptoms persist >1 week after symptoms improve, follow-up with your doctor.

## 2020-04-15 NOTE — ED Triage Notes (Signed)
Pt reports yesterday started with pain to her chest that radiated into her left arm and was pressure like. Pt states also has some pain to bilateral ribs. Denies SOB.

## 2020-04-15 NOTE — ED Triage Notes (Signed)
Pt reports that her husband tested positive for COVID a few days ago but her test came back negative.

## 2020-04-15 NOTE — Telephone Encounter (Signed)
Pt. Reports she started having chest pain yesterday. Hurts on left side of chest and goes down left arm, hurts on right ribs as well. Husband and daughter have COVID 86. Instructed to have someone drive her to ED. Verbalizes understanding.  Reason for Disposition . [1] Chest pain (or "angina") comes and goes AND [2] is happening more often (increasing in frequency) or getting worse (increasing in severity)  Answer Assessment - Initial Assessment Questions 1. LOCATION: "Where does it hurt?"       Left side 2. RADIATION: "Does the pain go anywhere else?" (e.g., into neck, jaw, arms, back)     Left arm, right ribs 3. ONSET: "When did the chest pain begin?" (Minutes, hours or days)      Yesterday 4. PATTERN "Does the pain come and go, or has it been constant since it started?"  "Does it get worse with exertion?"      Constant 5. DURATION: "How long does it last" (e.g., seconds, minutes, hours)     Hours 6. SEVERITY: "How bad is the pain?"  (e.g., Scale 1-10; mild, moderate, or severe)    - MILD (1-3): doesn't interfere with normal activities     - MODERATE (4-7): interferes with normal activities or awakens from sleep    - SEVERE (8-10): excruciating pain, unable to do any normal activities       8 7. CARDIAC RISK FACTORS: "Do you have any history of heart problems or risk factors for heart disease?" (e.g., angina, prior heart attack; diabetes, high blood pressure, high cholesterol, smoker, or strong family history of heart disease)     No 8. PULMONARY RISK FACTORS: "Do you have any history of lung disease?"  (e.g., blood clots in lung, asthma, emphysema, birth control pills)     No 9. CAUSE: "What do you think is causing the chest pain?"     Unsure 10. OTHER SYMPTOMS: "Do you have any other symptoms?" (e.g., dizziness, nausea, vomiting, sweating, fever, difficulty breathing, cough)       No 11. PREGNANCY: "Is there any chance you are pregnant?" "When was your last menstrual period?"  No  Protocols used: CHEST PAIN-A-AH

## 2020-04-15 NOTE — ED Notes (Signed)
Pt c/o sore throat as well and reports her husband has been dx with covid.

## 2020-04-16 ENCOUNTER — Telehealth: Payer: Self-pay | Admitting: Emergency Medicine

## 2020-04-16 LAB — SARS CORONAVIRUS 2 (TAT 6-24 HRS): SARS Coronavirus 2: POSITIVE — AB

## 2020-04-16 NOTE — Telephone Encounter (Signed)
Called to assure patient is aware of positive covid result.  Left message.

## 2020-05-24 ENCOUNTER — Other Ambulatory Visit: Payer: Self-pay | Admitting: Certified Nurse Midwife

## 2020-05-24 DIAGNOSIS — Z308 Encounter for other contraceptive management: Secondary | ICD-10-CM

## 2020-06-30 ENCOUNTER — Telehealth: Payer: Self-pay

## 2020-06-30 ENCOUNTER — Ambulatory Visit: Payer: Self-pay | Admitting: *Deleted

## 2020-06-30 NOTE — Telephone Encounter (Signed)
Pt calling; has been breastfeeding for a little over two years; has had no period until 03/30/20; after that and all of May; then has had normal menstrual; now has menstrual cramps all day qd; sex is painful; the last two days her nipples are sore.  Doesn't want to do anything b/c of the pain.  [phone]

## 2020-06-30 NOTE — Telephone Encounter (Signed)
Pt called in on the community line with several issues.   She has been breast feeding for 2 years now.   She is c/o sore nipples over the past couple of days.   She is also c/o abd cramping that is getting worse over the past month.   She is on birth control pill.   She had her first period on March 31, 2020 since having her daughter  2 yrs ago.   She is also having painful intercourse with her husband with bleeding afterwards.   "This has been going on for a while".   See notes below.  She is established with an Chief Financial Officer.   She has an appt in August with her.   I let her know to call OB-GYN and let them know of her problems and see if they could get her in sooner.   The appt she has is for her annual PAP smear and check up. I let her know the only other thing I could recommend would be an urgent care however with the nature of her symptoms and issues she would probably do/feel better going to her own doctor since she has an Chief Financial Officer.   She was agreeable to this and didn't want to go to an urgent care.  She is going to call her OB-GYN and see if they can work her in sooner.   I let her know she could call back with further questions/concerns.   I also instructed her to go on to the ED if she developed heavy vaginal bleeding and passing clots and if her cramping becomes worse.   She verbalized understanding and was agreeable to the plan discussed above.    Reason for Disposition  Pain during sexual intercourse  Answer Assessment - Initial Assessment Questions 1. LOCATION: "Where does it hurt?"      For last month I'm having abd pain constantly.  I'm breast feeding.  April 26 I had my first period.  Now I'm cramping and have a headache.   My nipples are real sore when she feeds.   She's 36 years old.   I'm on birth control.   It's painful to have sex with my husband.  I'm in pain after intercourse.  This has been going on for a while the painful intercourse. If I have intercourse I bleed.   2. ONSET:  "When did this episode of pain begin?"       The painful intercourse has been going on for a while.   The abd cramping has been going on for a month but it seems to be getting worse.   My daughter is 2 yrs old and she is still breast feeding but mainly for comfort.  I had my first period April 26.   I'm also on the birth control pill.  My nipples have started getting sore over the last couple of days.   "I don't think I could be pregnant".   "I feel nausea at times".  3. SEVERITY: "How bad is the pain?" "Are you missing school or work because of the pain?"  (e.g., Scale 1-10; mild, moderate, or severe)   - MILD (1-3): doesn't interfere with normal activities, lasting 1-2 days    - MODERATE (4-7): interferes with normal activities (missing work or school), lasting 2-3 days, some associated GI symptoms    - SEVERE (8-10): excruciating pain, lasting 2-7 days, associated GI symptoms, pain radiating into thighs and back     The abd cramping  is gradually getting worse over the past month.  It doesn't usually bother me when my daughter breast feeds.  The nipple soreness has just started over the last couple of days. 4. VAGINAL BLEEDING: "Describe the bleeding that you are having." "How much bleeding is there?"    - SPOTTING: spotting, or pinkish / brownish mucous discharge; does not fill panti-liner or pad    - MILD:  less than 1 pad / hour; less than patient's usual menstrual bleeding   - MODERATE: 1-2 pads / hour; 1 menstrual cup every 6 hours; small-medium blood clots (e.g., pea, grape, small coin)   - SEVERE: soaking 2 or more pads/hour for 2 or more hours; 1 menstrual cup every 2 hours; bleeding not contained by pads or continuous red blood from vagina; large blood clots (e.g., golf ball, large coin)      I had some bleeding with my first period on April 26 and now I'm having intermittent light bleeding.   She denies dizziness or weakness. 5. MENSTRUAL HISTORY:  "When did this menstrual period begin?",  "Is this a normal period for you?"       First period was March 31, 2020 since my pregnancy and I've been breast feeding for 2 years. 6. LMP:  "When did your last menstrual period begin?"     March 31, 2020 7. OTHER SYMPTOMS: "What other symptoms are you having with the pain?" (e.g., fever, dizzy/lighthead, vomiting, diarrhea, vaginal discharge)     Nausea, painful intercourse with husband with bleeding that's been going on a while and sore nipples.   "I don't know what is going on with me". 8. PREGNANCY: "Is there any chance you are pregnant?" (e.g., unprotected intercourse, missed birth control pill, broken condom)     I would not think so.   I'm on the birth control pill and I'm very faithful about taking it.   My symptoms make me wonder if I am pregnant.  Protocols used: ABDOMINAL PAIN - MENSTRUAL CRAMPS-A-AH

## 2020-07-01 ENCOUNTER — Other Ambulatory Visit (HOSPITAL_COMMUNITY)
Admission: RE | Admit: 2020-07-01 | Discharge: 2020-07-01 | Disposition: A | Discharge status: Home / Self Care | Payer: Medicaid Other | Source: Ambulatory Visit | Attending: Obstetrics and Gynecology | Admitting: Obstetrics and Gynecology

## 2020-07-01 ENCOUNTER — Ambulatory Visit (INDEPENDENT_AMBULATORY_CARE_PROVIDER_SITE_OTHER): Payer: Medicaid Other | Admitting: Obstetrics and Gynecology

## 2020-07-01 ENCOUNTER — Encounter: Payer: Self-pay | Admitting: Obstetrics and Gynecology

## 2020-07-01 ENCOUNTER — Other Ambulatory Visit: Payer: Self-pay

## 2020-07-01 VITALS — BP 110/68 | Ht 67.0 in | Wt 147.2 lb

## 2020-07-01 DIAGNOSIS — Z124 Encounter for screening for malignant neoplasm of cervix: Secondary | ICD-10-CM

## 2020-07-01 DIAGNOSIS — N870 Mild cervical dysplasia: Secondary | ICD-10-CM | POA: Diagnosis not present

## 2020-07-01 DIAGNOSIS — Z113 Encounter for screening for infections with a predominantly sexual mode of transmission: Secondary | ICD-10-CM | POA: Diagnosis not present

## 2020-07-01 DIAGNOSIS — R102 Pelvic and perineal pain unspecified side: Secondary | ICD-10-CM

## 2020-07-01 DIAGNOSIS — N941 Unspecified dyspareunia: Secondary | ICD-10-CM

## 2020-07-01 DIAGNOSIS — N760 Acute vaginitis: Secondary | ICD-10-CM | POA: Diagnosis not present

## 2020-07-01 DIAGNOSIS — N730 Acute parametritis and pelvic cellulitis: Secondary | ICD-10-CM

## 2020-07-01 MED ORDER — METRONIDAZOLE 0.75 % VA GEL: 1.0000 | Freq: Every day | VAGINAL | 0 refills | Status: DC

## 2020-07-01 MED ORDER — CEFTRIAXONE SODIUM 250 MG IJ SOLR
250.0000 mg | Freq: Once | INTRAMUSCULAR | Status: AC
  Administered 2020-07-01: 250 mg via INTRAMUSCULAR

## 2020-07-01 MED ORDER — DOXYCYCLINE HYCLATE 100 MG PO CAPS: 100.0000 mg | ORAL_CAPSULE | Freq: Two times a day (BID) | ORAL | 0 refills | Status: DC

## 2020-07-01 NOTE — Patient Instructions (Signed)
Pelvic Inflammatory Disease  Pelvic inflammatory disease (PID) is caused by an infection in some or all of the female reproductive organs. The infection can be in the uterus, ovaries, fallopian tubes, or the surrounding tissues in the pelvis. PID can cause abdominal or pelvic pain that comes on suddenly (acute pelvic pain). PID is a serious infection because it can lead to lasting (chronic) pelvic pain or the inability to have children (infertility). What are the causes? This condition is most often caused by bacteria that is spread during sexual contact. It can also be caused by a bacterial infection of the vagina (bacterial vaginosis) that is not spread by sexual contact. This condition occurs when the infection is not treated and the bacteria travel upward from the vagina or cervix into the reproductive organs. Bacteria may also be introduced into the reproductive organs following:  The birth of a baby.  A miscarriage.  An abortion.  Major pelvic surgery.  The insertion of an intrauterine device (IUD).  A sexual assault. What increases the risk? You are more likely to develop this condition if you:  Are younger than 36 years of age.  Are sexually active at a young age.  Have a history of STI (sexually transmitted infection) or PID.  Do not regularly use barrier contraception methods, such as condoms.  Have multiple sexual partners.  Have sex with someone who has symptoms of an STI.  Use a vaginal douche.  Have recently had an IUD inserted. What are the signs or symptoms? Symptoms of this condition include:  Abdominal or pelvic pain.  Fever.  Chills.  Abnormal vaginal discharge.  Abnormal uterine bleeding.  Unusual pain shortly after the end of a menstrual period.  Painful urination.  Pain with sex.  Nausea and vomiting. How is this diagnosed? This condition is diagnosed based on a pelvic exam and medical history. A pelvic exam can reveal signs of  infection, inflammation, and discharge in the vagina and the surrounding tissues. It can also help to identify painful areas. You may also have tests, such as:  Lab tests, including a pregnancy test, blood tests, and a urine test.  Culture tests of the vagina and cervix to check for an STI.  Ultrasound.  A laparoscopic procedure to look inside the pelvis.  Examination of vaginal discharge under a microscope. How is this treated? This condition may be treated with:  Antibiotic medicines taken by mouth (orally). For more severe cases, antibiotics may be given through an IV at the hospital.  Surgery. This is rare. Surgery may be needed if other treatments do not help.  Efforts to stop the spread of the infection. Sexual partners may need to be treated if the infection is caused by an STI. It may take weeks until you are completely well. If you are diagnosed with PID, you should also be checked for HIV (human immunodeficiency virus). Your health care provider may test you for infection again 3 months after treatment. You should not have unprotected sex. Follow these instructions at home:  Take over-the-counter and prescription medicines only as told by your health care provider.  If you were prescribed an antibiotic medicine, take it as told by your health care provider. Do not stop using the antibiotic even if you start to feel better.  Do not have sex until treatment is completed or as told by your health care provider. If PID is confirmed, your recent sexual partners will need treatment, especially if you had unprotected sex.  Keep all   follow-up visits as told by your health care provider. This is important. Contact a health care provider if:  You have increased or abnormal vaginal discharge.  Your pain does not improve.  You vomit.  You have a fever.  You cannot tolerate your medicines.  Your partner has an STI.  You have pain when you urinate. Get help right away if:   You have increased abdominal or pelvic pain.  You have chills.  Your symptoms are not better in 72 hours with treatment. Summary  Pelvic inflammatory disease (PID) is caused by an infection in some or all of the female reproductive organs.  PID is a serious infection because it can lead to lasting (chronic) pelvic pain or the inability to have children (infertility).  This infection is usually treated with antibiotic medicines.  Do not have sex until treatment is completed or as told by your health care provider. This information is not intended to replace advice given to you by your health care provider. Make sure you discuss any questions you have with your health care provider. Document Revised: 08/10/2018 Document Reviewed: 08/15/2018 Elsevier Patient Education  2020 Elsevier Inc.  

## 2020-07-01 NOTE — Progress Notes (Signed)
Patient ID: Rachel Montes, female   DOB: 1986-01-27, 34 y.o.   MRN: [id number]  Reason for Consult: Gynecologic Exam   Referred by Dalia Heading, CNM  Subjective:     HPI:  Rachel Montes is a 34 y.o. female. She has been experiencing pain with intercourse.   Gynecological History  Patient's last menstrual period was 06/16/2020.   History of fibroids, polyps, or ovarian cysts? : no  History of PCOS? no Hstory of Endometriosis? no History of abnormal pap smears? no Have you had any sexually transmitted infections in the past? no   Past Medical History:  Diagnosis Date  . BRCA negative    [employer] panel neg  . Family history of breast cancer   . Family history of ovarian cancer   . Family history of throat cancer   . GBS bacteriuria 10/01/2017  . History of abnormal cervical Papanicolaou smear   . Increased risk of breast cancer    IBIS==33% per [employer]  . 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 Postpartum care following vaginal delivery 04/12/2018  . Postpartum hemorrhage 04/2018  . Shoulder dystocia during labor and delivery 04/2018  . SPROM (prolonged spontaneous rupture of membranes) 04/10/2018  . Supervision of high risk pregnancy in first trimester 12/24/2020   Clinic [facility] Prenatal Labs Dating  Blood type:    Genetic Screen 1 Screen:    AFP:     Quad:     NIPS: Antibody:  Anatomic [place]  Rubella:    Varicella: '@VZVIGG'$ @ GTT Early: NA               Third trimester:  RPR:    Rhogam  HBsAg:    Vaccines TDAP:                       Flu Shot: Covid: HIV:    Baby Food Breast                               GBS:   GC/CT: Contraception  Pap: 2021 negative, hx LEEP   Family History  Problem Relation Age of Onset  . Breast cancer Mother 70  . Ovarian cancer Paternal Grandmother 24  . Cervical cancer Paternal Aunt 52       has contact  . Ovarian cancer Paternal Aunt   . Skin cancer Father   . Cervical cancer Maternal Grandmother        dx 63s  . Throat cancer Maternal  Grandfather   . Throat cancer Paternal Grandfather    Past Surgical History:  Procedure Laterality Date  . COLPOSCOPY  12/2017  . DILATION AND EVACUATION N/A 01/20/2021   Procedure: DILATATION AND EVACUATION;  Surgeon: Gae Dry, MD;  Location: [facility] ORS;  Service: Gynecology;  Laterality: N/A;  . LEEP      Short Social History:  Social History   Tobacco Use  . Smoking status: Never Smoker  . Smokeless tobacco: Never Used  Substance Use Topics  . Alcohol use: Yes    Comment: rarely    Allergies  Allergen Reactions  . Symbicort [Budesonide-Formoterol Fumarate] Nausea Only    Hands numb, dizzy    Current Outpatient Medications  Medication Sig Dispense Refill  . Cholecalciferol 75 MCG (3000 UT) TABS     . ELDERBERRY PO Take by mouth daily.    . medroxyPROGESTERone (DEPO-PROVERA) 150 MG/ML injection Inject 1 mL (150 mg total) into  the muscle every 3 (three) months. 1 mL 0  . Prenatal Vit-Fe Fumarate-FA (PRENATAL VITAMINS PO) Take by mouth.    . Probiotic Product (PROBIOTIC PO) Take by mouth daily.    . Turmeric (QC TUMERIC COMPLEX PO) Take by mouth daily.     No current facility-administered medications for this visit.    REVIEW OF SYSTEMS      Objective:  Objective   Vitals:   07/01/20 1626  BP: 110/68  Weight: 147 lb 3.2 oz (66.8 kg)  Height: '5\' 7"'$  (1.702 m)   Body mass index is 23.05 kg/m.  Physical Exam Vitals and nursing note reviewed. Exam conducted with a chaperone present.  Constitutional:      Appearance: Normal appearance. She is well-developed.  HENT:     Head: Normocephalic and atraumatic.  Eyes:     Extraocular Movements: Extraocular movements intact.     Pupils: Pupils are equal, round, and reactive to light.  Cardiovascular:     Rate and Rhythm: Normal rate and regular rhythm.  Pulmonary:     Effort: Pulmonary effort is normal. No respiratory distress.     Breath sounds: Normal breath sounds.  Abdominal:     General: Abdomen is  flat.     Palpations: Abdomen is soft.  Genitourinary:    Comments: External: Normal appearing vulva. No lesions noted.  Bimanual examination: Uterus midline, tender, normal in size, shape and contour.  + CMT. No adnexal masses. + adnexal tenderness. Pelvis not fixed.  Breast exam: exam not performed Musculoskeletal:        General: No signs of injury.  Skin:    General: Skin is warm and dry.  Neurological:     Mental Status: She is alert and oriented to person, place, and time.  Psychiatric:        Behavior: Behavior normal.        Thought Content: Thought content normal.        Judgment: Judgment normal.     Assessment/Plan:     34 yo with dyspareunia Will follow up for a pelvic  US Will treat for PID today- rocephin given, antibiotic rx sent Nuswab and pap smear today.  More than 15 minutes were spent face to face with the patient in the room, reviewing the medical record, labs and images, and coordinating care for the patient. The plan of management was discussed in detail and counseling was provided.    Adrian Prows MD, [facility], [facility] 07/01/2020 5:34 PM

## 2020-07-04 LAB — CYTOLOGY - PAP
Comment: NEGATIVE
Diagnosis: NEGATIVE
High risk HPV: NEGATIVE

## 2020-07-05 LAB — NUSWAB VAGINITIS PLUS (VG+)
Atopobium vaginae: HIGH Score — AB
Candida albicans, NAA: NEGATIVE
Candida glabrata, NAA: NEGATIVE
Chlamydia trachomatis, NAA: NEGATIVE
Neisseria gonorrhoeae, NAA: NEGATIVE
Trich vag by NAA: NEGATIVE

## 2020-07-10 NOTE — Telephone Encounter (Signed)
Pt had appt c [facility] 07/01/20.

## 2020-07-16 ENCOUNTER — Ambulatory Visit: Payer: Medicaid Other

## 2020-07-16 ENCOUNTER — Ambulatory Visit: Payer: Medicaid Other | Admitting: Certified Nurse Midwife

## 2020-07-23 ENCOUNTER — Telehealth: Payer: Self-pay

## 2020-07-23 ENCOUNTER — Other Ambulatory Visit: Payer: Self-pay | Admitting: Obstetrics and Gynecology

## 2020-07-23 DIAGNOSIS — N730 Acute parametritis and pelvic cellulitis: Secondary | ICD-10-CM

## 2020-07-23 MED ORDER — DOXYCYCLINE HYCLATE 100 MG PO CAPS: 100.0000 mg | ORAL_CAPSULE | Freq: Two times a day (BID) | ORAL | 0 refills | Status: DC

## 2020-07-23 MED ORDER — METRONIDAZOLE 0.75 % VA GEL: 1.0000 | Freq: Every day | VAGINAL | 0 refills | Status: DC

## 2020-07-23 NOTE — Telephone Encounter (Signed)
Will send medication so she can be retreated- please the patient know. Thank you

## 2020-07-23 NOTE — Telephone Encounter (Signed)
Spoke w/patient. She reports she & her husband had intercourse about 3 days ago and the pain started yesterday. She had her period 07/15/20. She hasn't taken anything for the pain. Advised can take Ibuprofen up to 800mg  (400mg /q4h, 600mg /q6h, 800mg /q8h) and try heating pad. Will sent to [facility] for review as pt is inquiring if she needs retreatment or u/s apt to be scheduled sooner.

## 2020-07-23 NOTE — Telephone Encounter (Signed)
Patient was seen 07/01/20 by [facility] and treated for PID. After the meds were complete, she felt really good, but now she is starting to get sypmtoms back. She laid on the couch all day yesterday d/t abd cramping, dizzy/lightheaded. Today she continues to have abdominal cramping. Her next apt isn't until Sept for the u/s. [phone]

## 2020-07-24 NOTE — Telephone Encounter (Signed)
Pt aware.

## 2020-07-26 ENCOUNTER — Ambulatory Visit
Admission: EM | Admit: 2020-07-26 | Discharge: 2020-07-26 | Disposition: A | Discharge status: Home / Self Care | Payer: Medicaid Other | Attending: Family Medicine | Admitting: Family Medicine

## 2020-07-26 ENCOUNTER — Other Ambulatory Visit: Payer: Self-pay

## 2020-07-26 ENCOUNTER — Encounter: Payer: Self-pay | Admitting: Emergency Medicine

## 2020-07-26 DIAGNOSIS — R42 Dizziness and giddiness: Secondary | ICD-10-CM

## 2020-07-26 MED ORDER — MECLIZINE HCL 25 MG PO TABS: 25.0000 mg | ORAL_TABLET | Freq: Three times a day (TID) | ORAL | 0 refills | Status: DC | PRN

## 2020-07-26 NOTE — ED Provider Notes (Signed)
[facility]    CSN: [id number] Arrival date & time: 07/26/20  1037      History   Chief Complaint Chief Complaint  Patient presents with  . Dizziness  . Headache   HPI  34 year old female presents with the above complaints.  Patient reports ongoing dizziness as well as ongoing headache.  Intermittent.  Patient reports that her dizziness feels like she is spinning.  She also reports intermittent headache.  Pain 3/10 in severity.  No nausea vomiting.  No relieving factors.  No respiratory symptoms.  No sick contacts.    Past Medical History:  Diagnosis Date  . Family history of breast cancer   . Family history of breast cancer   . Family history of ovarian cancer    genetic testing letter sent 9/20  . Family history of ovarian cancer   . Family history of throat cancer   . GBS bacteriuria 10/01/2017  . History of abnormal cervical Papanicolaou smear   . 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 Postpartum care following vaginal delivery 04/12/2018  . Postpartum hemorrhage 04/2018  . Shoulder dystocia during labor and delivery 04/2018  . SPROM (prolonged spontaneous rupture of membranes) 04/10/2018    Patient Active Problem List   Diagnosis Date Noted  . Genetic testing 01/24/2020  . Family history of breast cancer   . Family history of throat cancer   . PPH (postpartum hemorrhage) 07/06/2019  . Family history of ovarian cancer 07/06/2019  . Family history of breast cancer in mother 07/06/2019  . Shoulder dystocia during labor and delivery, delivered 04/12/2018  . Rh negative state in antepartum period 12/27/2017  . 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 COLPOSCOPY  12/2017  . LEEP      OB History    Gravida  3   Para  2   Term  2   Preterm      AB  1   Living  2     SAB      TAB  1   Ectopic      Multiple  0   Live Births  2              Home Medications    Prior to Admission medications   Medication Sig Start Date End Date Taking? Authorizing Provider  CAMILA 0.35 MG tablet TAKE 1 TABLET BY MOUTH EVERY DAY 05/26/20  Yes Farrel Conners, CNM  meclizine (ANTIVERT) 25 MG tablet Take 1 tablet (25 mg total) by mouth 3 (three) times daily as needed for dizziness. 07/26/20   Tommie Sams, DO    Family History Family History  Problem Relation Age of Onset  . Breast cancer Mother 37  . Ovarian cancer Paternal Grandmother 67  . Cervical cancer Paternal Aunt 77       has contact  . Skin cancer Father   . Cervical cancer Maternal Grandmother        dx 30s  . Throat cancer Maternal Grandfather   . Throat cancer Paternal Grandfather     Social History Social History   Tobacco Use  . Smoking status: Never Smoker  . Smokeless tobacco: Never Used  Vaping Use  . Vaping Use: Never used  Substance Use Topics  . Alcohol use: Yes    Comment: rarely  . Drug use: No     Allergies   Patient has  no known allergies.   Review of Systems Review of Systems  Neurological: Positive for dizziness and headaches.   Physical Exam Triage Vital Signs ED Triage Vitals  Enc Vitals Group     BP 07/26/20 1047 127/80     Pulse Rate 07/26/20 1047 84     Resp 07/26/20 1047 18     Temp 07/26/20 1047 99.5 F (37.5 C)     Temp Source 07/26/20 1047 Oral     SpO2 07/26/20 1047 100 %     Weight 07/26/20 1046 142 lb (64.4 kg)     Height 07/26/20 1046 5\' 7"  (1.702 m)     Head Circumference --      Peak Flow --      Pain Score 07/26/20 1046 3     Pain Loc --      Pain Edu? --      Excl. in GC? --     Updated Vital Signs BP 127/80 (BP Location: Left Arm)   Pulse 84   Temp 99.5 F (37.5 C) (Oral)   Resp 18   Ht 5\' 7"  (1.702 m)   Wt 64.4 kg   LMP 07/17/2020 (Exact Date)   SpO2 100%   BMI 22.24 kg/m   Visual Acuity Right Eye Distance:   Left Eye Distance:   Bilateral Distance:    Right Eye Near:   Left Eye Near:     Bilateral Near:     Physical Exam Vitals and nursing note reviewed.  Constitutional:      General: She is not in acute distress.    Appearance: Normal appearance. She is ill-appearing.  HENT:     Head: Normocephalic and atraumatic.  Eyes:     General:        Right eye: No discharge.        Left eye: No discharge.     Conjunctiva/sclera: Conjunctivae normal.     Pupils: Pupils are equal, round, and reactive to light.  Cardiovascular:     Rate and Rhythm: Normal rate and regular rhythm.  Pulmonary:     Effort: Pulmonary effort is normal.     Breath sounds: Normal breath sounds. No wheezing, rhonchi or rales.  Neurological:     General: No focal deficit present.     Mental Status: She is alert.  Psychiatric:        Mood and Affect: Mood normal.        Behavior: Behavior normal.    UC Treatments / Results  Labs (all labs ordered are listed, but only abnormal results are displayed) Labs Reviewed - No data to display  EKG   Radiology No results found.  Procedures Procedures (including critical care time)  Medications Ordered in UC Medications - No data to display  Initial Impression / Assessment and Plan / UC Course  I have reviewed the triage vital signs and the nursing notes.  Pertinent labs & imaging results that were available during my care of the patient were reviewed by me and considered in my medical decision making (see chart for details).    34 year old female presents with vertigo.  Treating with meclizine.    Final Clinical Impressions(s) / UC Diagnoses   Final diagnoses:  Vertigo     Discharge Instructions     Medication as directed.  If persists, get referral from provider listed on medicaid card.  Try epley maneuver.  Take care  Dr. 09/16/2020    ED Prescriptions    Medication Sig Dispense Auth.  Provider   meclizine (ANTIVERT) 25 MG tablet Take 1 tablet (25 mg total) by mouth 3 (three) times daily as needed for dizziness. 30 tablet  Tommie Sams, DO     PDMP not reviewed this encounter.   Tommie Sams, DO 07/26/20 1210

## 2020-07-26 NOTE — ED Triage Notes (Signed)
Patient also c/o headache that is off & on x 1 week.

## 2020-07-26 NOTE — ED Triage Notes (Addendum)
Patient in today c/o dizziness x 1 week, getting worse. Patient states she is not eating or drinking as much as normal.

## 2020-07-26 NOTE — Discharge Instructions (Signed)
Medication as directed.  If persists, get referral from provider listed on medicaid card.  Try epley maneuver.  Take care  Dr. Adriana Simas

## 2020-08-19 ENCOUNTER — Other Ambulatory Visit: Payer: Self-pay

## 2020-08-19 ENCOUNTER — Ambulatory Visit (INDEPENDENT_AMBULATORY_CARE_PROVIDER_SITE_OTHER): Payer: Medicaid Other

## 2020-08-19 ENCOUNTER — Encounter: Payer: Self-pay | Admitting: Obstetrics and Gynecology

## 2020-08-19 ENCOUNTER — Ambulatory Visit (INDEPENDENT_AMBULATORY_CARE_PROVIDER_SITE_OTHER): Payer: Medicaid Other | Admitting: Obstetrics and Gynecology

## 2020-08-19 VITALS — BP 106/70 | HR 68 | Resp 16 | Ht 67.0 in | Wt 147.2 lb

## 2020-08-19 DIAGNOSIS — Z113 Encounter for screening for infections with a predominantly sexual mode of transmission: Secondary | ICD-10-CM

## 2020-08-19 DIAGNOSIS — Z1231 Encounter for screening mammogram for malignant neoplasm of breast: Secondary | ICD-10-CM

## 2020-08-19 DIAGNOSIS — Z9189 Other specified personal risk factors, not elsewhere classified: Secondary | ICD-10-CM | POA: Diagnosis not present

## 2020-08-19 DIAGNOSIS — Z202 Contact with and (suspected) exposure to infections with a predominantly sexual mode of transmission: Secondary | ICD-10-CM

## 2020-08-19 DIAGNOSIS — Z803 Family history of malignant neoplasm of breast: Secondary | ICD-10-CM

## 2020-08-19 DIAGNOSIS — R102 Pelvic and perineal pain: Secondary | ICD-10-CM
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
## 2020-08-21 ENCOUNTER — Telehealth: Payer: Self-pay

## 2020-08-21 LAB — HSV(HERPES SMPLX)ABS-I+II(IGG+IGM)-BLD
HSV 1 Glycoprotein G Ab, IgG: <0.91 index (ref 0.00–0.90)
HSV 2 IgG, Type Spec: <0.91 index (ref 0.00–0.90)
HSVI/II Comb IgM: <0.91 Ratio (ref 0.00–0.90)

## 2020-08-21 NOTE — Telephone Encounter (Signed)
Pt calling; at appt Tues pt forgot to mention she has been having itching in her vagina; feels like a yeast inf; is there a med that can help?  [phone]  Adv pt to try Monistat 3d or 7d.  If that doesn't help to schedule appt and not use it the night before appt.

## 2020-08-27 ENCOUNTER — Encounter: Payer: Self-pay | Admitting: Obstetrics and Gynecology

## 2020-08-28 ENCOUNTER — Ambulatory Visit: Payer: Self-pay | Admitting: *Deleted

## 2020-08-28 NOTE — Telephone Encounter (Signed)
Patient calls with nasal congestion and feeling like her throat has a lump in it"like I need to keep swallowing. Feels airy- headed at times especially this morning  While running the shower for steam to help with nasal congestion. No CP/no fever/cough.Stated these episodes occurred several times over the year.  Care Advice including test for Covid, mucinex and NS nasal spray OTC with increased water intake at least 6-7 glasses daily.Reviewed urgent symptoms she should seek immediate treatment. Patient verbalized understanding.  Answer Assessment - Initial Assessment Questions 1. RESPIRATORY STATUS: "Describe your breathing?" (e.g., wheezing, shortness of breath, unable to speak, severe coughing)      Last night she could not take a deep breath, like something stuck in throat.  2. ONSET: "When did this breathing problem begin?"       3. PATTERN "Does the difficult breathing come and go, or has it been constant since it started?"      Comes and goes 4. SEVERITY: "How bad is your breathing?" (e.g., mild, moderate, severe)    - MILD: No SOB at rest, mild SOB with walking, speaks normally in sentences, can lay down, no retractions, pulse < 100.    - MODERATE: SOB at rest, SOB with minimal exertion and prefers to sit, cannot lie down flat, speaks in phrases, mild retractions, audible wheezing, pulse 100-120.    - SEVERE: Very SOB at rest, speaks in single words, struggling to breathe, sitting hunched forward, retractions, pulse > 120      moderate 5. RECURRENT SYMPTOM: "Have you had difficulty breathing before?" If Yes, ask: "When was the last time?" and "What happened that time?"      On and off over a year. 6. CARDIAC HISTORY: "Do you have any history of heart disease?" (e.g., heart attack, angina, bypass surgery, angioplasty)      no 7. LUNG HISTORY: "Do you have any history of lung disease?"  (e.g., pulmonary embolus, asthma, emphysema)     no 8. CAUSE: "What do you think is causing the breathing  problem?"      No idea 9. OTHER SYMPTOMS: "Do you have any other symptoms? (e.g., dizziness, runny nose, cough, chest pain, fever)     Dizziness, nasal congestion 10. PREGNANCY: "Is there any chance you are pregnant?" "When was your last menstrual period?"       no 11. TRAVEL: "Have you traveled out of the country in the last month?" (e.g., travel history, exposures)       no  Protocols used: BREATHING DIFFICULTY-A-AH

## 2020-11-06 ENCOUNTER — Ambulatory Visit
Admission: EM | Admit: 2020-11-06 | Discharge: 2020-11-06 | Disposition: A | Discharge status: Home / Self Care | Payer: Medicaid Other | Attending: Physician Assistant | Admitting: Physician Assistant

## 2020-11-06 ENCOUNTER — Ambulatory Visit (INDEPENDENT_AMBULATORY_CARE_PROVIDER_SITE_OTHER): Payer: Medicaid Other

## 2020-11-06 ENCOUNTER — Encounter: Payer: Self-pay | Admitting: Emergency Medicine

## 2020-11-06 ENCOUNTER — Other Ambulatory Visit: Payer: Self-pay

## 2020-11-06 DIAGNOSIS — R0789 Other chest pain: Secondary | ICD-10-CM

## 2020-11-06 DIAGNOSIS — R0602 Shortness of breath: Secondary | ICD-10-CM

## 2020-11-06 DIAGNOSIS — R002 Palpitations: Secondary | ICD-10-CM | POA: Insufficient documentation

## 2020-11-06 DIAGNOSIS — R059 Cough, unspecified: Secondary | ICD-10-CM

## 2020-11-06 LAB — CBC WITH DIFFERENTIAL/PLATELET
Abs Immature Granulocytes: 0.02 10*3/uL (ref 0.00–0.07)
Basophils Absolute: 0.1 10*3/uL (ref 0.0–0.1)
Basophils Relative: 1 %
Eosinophils Absolute: 0.1 10*3/uL (ref 0.0–0.5)
Eosinophils Relative: 1 %
HCT: 39.4 % (ref 36.0–46.0)
Hemoglobin: 13.5 g/dL (ref 12.0–15.0)
Immature Granulocytes: 0 %
Lymphocytes Relative: 26 %
Lymphs Abs: 2 10*3/uL (ref 0.7–4.0)
MCH: 29.5 pg (ref 26.0–34.0)
MCHC: 34.3 g/dL (ref 30.0–36.0)
MCV: 86 fL (ref 80.0–100.0)
Monocytes Absolute: 0.7 10*3/uL (ref 0.1–1.0)
Monocytes Relative: 9 %
Neutro Abs: 4.8 10*3/uL (ref 1.7–7.7)
Neutrophils Relative %: 63 %
Platelets: 342 10*3/uL (ref 150–400)
RBC: 4.58 MIL/uL (ref 3.87–5.11)
RDW: 13.2 % (ref 11.5–15.5)
WBC: 7.6 10*3/uL (ref 4.0–10.5)
nRBC: 0 % (ref 0.0–0.2)

## 2020-11-06 LAB — BASIC METABOLIC PANEL
Anion gap: 9 (ref 5–15)
BUN: 8 mg/dL (ref 6–20)
CO2: 23 mmol/L (ref 22–32)
Calcium: 9.6 mg/dL (ref 8.9–10.3)
Chloride: 103 mmol/L (ref 98–111)
Creatinine, Ser: 0.59 mg/dL (ref 0.44–1.00)
GFR, Estimated: >60 mL/min (ref >60)
Glucose, Bld: 98 mg/dL (ref 70–99)
Potassium: 4.3 mmol/L (ref 3.5–5.1)
Sodium: 135 mmol/L (ref 135–145)

## 2020-11-06 LAB — BRAIN NATRIURETIC PEPTIDE: B Natriuretic Peptide: 33.9 pg/mL (ref 0.0–100.0)

## 2020-11-06 LAB — TSH: TSH: 1.556 u[IU]/mL (ref 0.350–4.500)

## 2020-11-06 MED ORDER — ALBUTEROL SULFATE HFA 108 (90 BASE) MCG/ACT IN AERS
1.0000 | INHALATION_SPRAY | Freq: Four times a day (QID) | RESPIRATORY_TRACT | 0 refills | Status: DC | PRN
Start: 2020-11-06 — End: 2020-12-02

## 2020-11-06 NOTE — Discharge Instructions (Signed)
Your EKG is normal and so is your chest x-ray. We have drawn some labs and will call you with results. You should follow-up with your PCP for further work-up of your symptoms. If you have any acute worsening of your shortness of breath, chest pain or palpitations call EMS or go immediately to emergency department. Try the inhaler at this time. Consider using Claritin to see if you have any underlying allergies.

## 2020-11-06 NOTE — ED Triage Notes (Signed)
Pt c/o shortness of breath, cough. Started about 3 months ago. She states she had a "tickle" in her throat and started coughing. She states she gets lightheaded at times.

## 2020-11-06 NOTE — ED Provider Notes (Addendum)
[facility]    CSN: [id number] Arrival date & time: 11/06/20  3016      History   Chief Complaint Chief Complaint  Patient presents with  . Shortness of Breath  . Cough    HPI Rachel Montes is a 34 y.o. female   presenting for complaints of feeling short of breath and having a cough that has been dry for the past 3 months.  She states over the past couple days she has coughed up a little bit of mucus.  Patient admits to feeling lightheaded when she gets into a coughing fit.  She states that she feels short of breath currently.  She says she occasionally has some central chest pressure and feels like her heart is racing.  She denies any history of cardiac or pulmonary disease.  She says she has not seen anyone about this.  She says she does have a PCP but has not actually seen them yet.  Patient denies any chronic medical problems.  There is no family history of cardiac or pulmonary disease in someone under the age of 40.  Patient denies any history of blood clotting or bleeding disorders.  No history of PE or DVT.  Patient is a non-smoker.  She denies any thyroid problems and is not diabetic.  She has no other concerns today.  HPI  Past Medical History:  Diagnosis Date  . BRCA negative    [facility] panel neg  . Family history of breast cancer   . Family history of ovarian cancer   . Family history of throat cancer   . GBS bacteriuria 10/01/2017  . History of abnormal cervical Papanicolaou smear   . Increased risk of breast cancer    IBIS==33% per [facility]  . 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 Postpartum care following vaginal delivery 04/12/2018  . Postpartum hemorrhage 04/2018  . Shoulder dystocia during labor and delivery 04/2018  . SPROM (prolonged spontaneous rupture of membranes) 04/10/2018    Patient Active Problem List   Diagnosis Date Noted  . At high risk for breast cancer 08/19/2020  . Genetic testing 01/24/2020  . Family history of breast cancer    . Family history of throat cancer   . PPH (postpartum hemorrhage) 07/06/2019  . Family history of ovarian cancer 07/06/2019  . Family history of breast cancer in mother 07/06/2019  . Shoulder dystocia during labor and delivery, delivered 04/12/2018  . Rh negative state in antepartum period 12/27/2017  . 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 COLPOSCOPY  12/2017  . LEEP      OB History    Gravida  3   Para  2   Term  2   Preterm      AB  1   Living  2     SAB      TAB  1   Ectopic      Multiple  0   Live Births  2            Home Medications    Prior to Admission medications   Medication Sig Start Date End Date Taking? Authorizing Provider  CAMILA 0.35 MG tablet TAKE 1 TABLET BY MOUTH EVERY DAY 05/26/20  Yes Farrel Conners, CNM  albuterol (VENTOLIN HFA) 108 (90 Base) MCG/ACT inhaler Inhale 1-2 puffs into the lungs every 6 (six) hours as needed for wheezing or shortness  of breath. 11/06/20 12/06/20  Danton Clap, PA-C    Family History Family History  Problem Relation Age of Onset  . Breast cancer Mother 14  . Ovarian cancer Paternal Grandmother 50  . Cervical cancer Paternal Aunt 70       has contact  . Skin cancer Father   . Cervical cancer Maternal Grandmother        dx 33s  . Throat cancer Maternal Grandfather   . Throat cancer Paternal Grandfather     Social History Social History   Tobacco Use  . Smoking status: Never Smoker  . Smokeless tobacco: Never Used  Vaping Use  . Vaping Use: Never used  Substance Use Topics  . Alcohol use: Yes    Comment: rarely  . Drug use: No     Allergies   Patient has no known allergies.   Review of Systems Review of Systems  Constitutional: Positive for fatigue. Negative for chills, diaphoresis and fever.  HENT: Negative for congestion, ear pain, rhinorrhea, sinus pressure, sinus pain and  sore throat.   Respiratory: Positive for cough and shortness of breath.   Cardiovascular: Positive for chest pain and palpitations.  Gastrointestinal: Negative for abdominal pain, nausea and vomiting.  Musculoskeletal: Negative for arthralgias and myalgias.  Skin: Negative for rash.  Neurological: Positive for light-headedness and headaches. Negative for weakness.  Hematological: Negative for adenopathy.     Physical Exam Triage Vital Signs ED Triage Vitals  Enc Vitals Group     BP 11/06/20 1052 (!) 142/90     Pulse Rate 11/06/20 1052 88     Resp --      Temp 11/06/20 1052 97.7 F (36.5 C)     Temp Source 11/06/20 1052 Oral     SpO2 11/06/20 1052 100 %     Weight 11/06/20 1050 147 lb 4.3 oz (66.8 kg)     Height 11/06/20 1050 $RemoveBefor'5\' 7"'CVdIUoCcvivl$  (1.702 m)     Head Circumference --      Peak Flow --      Pain Score 11/06/20 1050 0     Pain Loc --      Pain Edu? --      Excl. in [place]? --    No data found.  Updated Vital Signs BP (!) 142/90 (BP Location: Right Arm)   Pulse 88   Temp 97.7 F (36.5 C) (Oral)   Ht $R'5\' 7"'Dt$  (1.702 m)   Wt 147 lb 4.3 oz (66.8 kg)   LMP 10/28/2020 (Approximate)   SpO2 100%   Breastfeeding No   BMI 23.07 kg/m       Physical Exam Vitals and nursing note reviewed.  Constitutional:      General: She is not in acute distress.    Appearance: Normal appearance. She is not ill-appearing, toxic-appearing or diaphoretic.  HENT:     Head: Normocephalic and atraumatic.     Nose: Nose normal.     Mouth/Throat:     Mouth: Mucous membranes are moist.     Pharynx: Oropharynx is clear.  Eyes:     General: No scleral icterus.       Right eye: No discharge.        Left eye: No discharge.     Conjunctiva/sclera: Conjunctivae normal.  Cardiovascular:     Rate and Rhythm: Normal rate and regular rhythm.     Heart sounds: Normal heart sounds.  Pulmonary:     Effort: Pulmonary effort is normal. No respiratory distress.  Breath sounds: Normal breath sounds. No  wheezing, rhonchi or rales.  Musculoskeletal:     Cervical back: Neck supple.  Skin:    General: Skin is dry.  Neurological:     General: No focal deficit present.     Mental Status: She is alert. Mental status is at baseline.     Motor: No weakness.     Gait: Gait normal.  Psychiatric:        Mood and Affect: Mood normal.        Behavior: Behavior normal.        Thought Content: Thought content normal.      UC Treatments / Results  Labs (all labs ordered are listed, but only abnormal results are displayed) Labs Reviewed  CBC WITH DIFFERENTIAL/PLATELET  BASIC METABOLIC PANEL  BRAIN NATRIURETIC PEPTIDE    EKG   Radiology DG Chest 2 View  Result Date: 11/06/2020 CLINICAL DATA:  Cough.  Shortness of breath. EXAM: CHEST - 2 VIEW COMPARISON:  04/15/2020. FINDINGS: Mediastinum hilar structures normal. Lungs are clear of acute infiltrates. No pleural effusion or pneumothorax. Heart size normal. Mild thoracic spine scoliosis concave left. No acute abnormality identified. IMPRESSION: No acute cardiopulmonary disease. Electronically Signed   By: Marcello Moores  Register   On: 11/06/2020 11:59    Procedures ED EKG  Date/Time: 11/06/2020 11:53 AM Performed by: Danton Clap, PA-C Authorized by: Danton Clap, PA-C   ECG reviewed by ED Physician in the absence of a cardiologist: yes   Previous ECG:    Previous ECG:  Unavailable Interpretation:    Interpretation: normal   Rate:    ECG rate:  60   ECG rate assessment: normal   Rhythm:    Rhythm: sinus rhythm   Ectopy:    Ectopy: none   QRS:    QRS axis:  Normal Conduction:    Conduction: normal   ST segments:    ST segments:  Normal T waves:    T waves: normal   Comments:     Normal sinus rhythm and regular rate   (including critical care time)  Medications Ordered in UC Medications - No data to display  Initial Impression / Assessment and Plan / UC Course  I have reviewed the triage vital signs and the nursing  notes.  Pertinent labs & imaging results that were available during my care of the patient were reviewed by me and considered in my medical decision making (see chart for details).   34 year old female presenting for complaints of shortness of breath over the past 3 months.  Shortness of breath has been associated with cough and also central chest pressure and palpitations at times.  Patient currently complains of shortness of breath and cough.  Denies any chest discomfort or palpitations currently.  All vital signs are stable.  Pulse rate is 88.  Blood pressure slightly elevated at 142/90.  Patient is afebrile.  Oxygen 100%.  Patient is well-appearing and in no acute distress.  Exam is benign.  Heart regular rate and rhythm and chest is clear to auscultation.  Chest x-ray and EKG obtained due to duration of symptoms.  EKG is within normal limits.  Normal sinus rhythm and regular rate. Chest x-ray is within normal limits. Discussed results with patient.  Multiple labs drawn today including CBC, BMP, BNP and TSH. Advised patient we will call with results. Advised her to make a follow-up appointment with her PCP. She has Medicaid so I cannot put in any referrals for her.  Sent an inhaler for her to try to see if that helps her symptoms. Advised trying Claritin to see if any of this is allergy related. ED precautions thoroughly reviewed with patient.  Called to discuss the results of the CBC, BMP, BNP and TSH with patient.  Advised that all labs are within normal limits.  Encouraged her to follow-up with PCP.  ED precautions again reviewed.  Final Clinical Impressions(s) / UC Diagnoses   Final diagnoses:  Shortness of breath  Cough  Palpitations  Other chest pain     Discharge Instructions     Your EKG is normal and so is your chest x-ray. We have drawn some labs and will call you with results. You should follow-up with your PCP for further work-up of your symptoms. If you have any acute  worsening of your shortness of breath, chest pain or palpitations call EMS or go immediately to emergency department. Try the inhaler at this time. Consider using Claritin to see if you have any underlying allergies.    ED Prescriptions    Medication Sig Dispense Auth. Provider   albuterol (VENTOLIN HFA) 108 (90 Base) MCG/ACT inhaler Inhale 1-2 puffs into the lungs every 6 (six) hours as needed for wheezing or shortness of breath. 1 g Danton Clap, PA-C     PDMP not reviewed this encounter.   Danton Clap, PA-C 11/06/20 1217    Laurene Footman B, PA-C 11/06/20 2007

## 2020-11-18 DIAGNOSIS — R0602 Shortness of breath: Secondary | ICD-10-CM | POA: Insufficient documentation

## 2020-11-18 DIAGNOSIS — J45909 Unspecified asthma, uncomplicated: Secondary | ICD-10-CM | POA: Insufficient documentation

## 2020-11-19 ENCOUNTER — Other Ambulatory Visit: Payer: Self-pay

## 2020-11-19 ENCOUNTER — Ambulatory Visit
Admission: RE | Admit: 2020-11-19 | Discharge: 2020-11-19 | Disposition: A | Discharge status: Home / Self Care | Payer: Medicaid Other | Source: Ambulatory Visit | Attending: Obstetrics and Gynecology | Admitting: Obstetrics and Gynecology

## 2020-11-19 DIAGNOSIS — Z803 Family history of malignant neoplasm of breast: Secondary | ICD-10-CM | POA: Diagnosis not present

## 2020-11-19 DIAGNOSIS — Z9189 Other specified personal risk factors, not elsewhere classified: Secondary | ICD-10-CM | POA: Insufficient documentation

## 2020-11-19 DIAGNOSIS — Z1231 Encounter for screening mammogram for malignant neoplasm of breast: Secondary | ICD-10-CM

## 2020-11-27 ENCOUNTER — Emergency Department: Payer: Medicaid Other

## 2020-11-27 ENCOUNTER — Encounter: Payer: Self-pay | Admitting: Emergency Medicine

## 2020-11-27 ENCOUNTER — Other Ambulatory Visit: Payer: Self-pay

## 2020-11-27 DIAGNOSIS — Z8616 Personal history of COVID-19: Secondary | ICD-10-CM | POA: Insufficient documentation

## 2020-11-27 DIAGNOSIS — R059 Cough, unspecified: Secondary | ICD-10-CM | POA: Diagnosis not present

## 2020-11-27 DIAGNOSIS — R079 Chest pain, unspecified: Secondary | ICD-10-CM | POA: Diagnosis not present

## 2020-11-27 DIAGNOSIS — Z20822 Contact with and (suspected) exposure to covid-19: Secondary | ICD-10-CM | POA: Insufficient documentation

## 2020-11-27 DIAGNOSIS — R072 Precordial pain: Secondary | ICD-10-CM | POA: Diagnosis present

## 2020-11-27 DIAGNOSIS — R0602 Shortness of breath: Secondary | ICD-10-CM | POA: Insufficient documentation

## 2020-11-27 LAB — BASIC METABOLIC PANEL
Anion gap: 9 (ref 5–15)
BUN: 9 mg/dL (ref 6–20)
CO2: 24 mmol/L (ref 22–32)
Calcium: 9.5 mg/dL (ref 8.9–10.3)
Chloride: 104 mmol/L (ref 98–111)
Creatinine, Ser: 0.6 mg/dL (ref 0.44–1.00)
GFR, Estimated: >60 mL/min (ref >60)
Glucose, Bld: 126 mg/dL — ABNORMAL HIGH (ref 70–99)
Potassium: 4.2 mmol/L (ref 3.5–5.1)
Sodium: 137 mmol/L (ref 135–145)

## 2020-11-27 LAB — CBC
HCT: 38.4 % (ref 36.0–46.0)
Hemoglobin: 12.9 g/dL (ref 12.0–15.0)
MCH: 29.2 pg (ref 26.0–34.0)
MCHC: 33.6 g/dL (ref 30.0–36.0)
MCV: 86.9 fL (ref 80.0–100.0)
Platelets: 308 10*3/uL (ref 150–400)
RBC: 4.42 MIL/uL (ref 3.87–5.11)
RDW: 12.7 % (ref 11.5–15.5)
WBC: 8.3 10*3/uL (ref 4.0–10.5)
nRBC: 0 % (ref 0.0–0.2)

## 2020-11-27 LAB — POC URINE PREG, ED: Preg Test, Ur: NEGATIVE

## 2020-11-27 LAB — TROPONIN I (HIGH SENSITIVITY): Troponin I (High Sensitivity): <2 ng/L (ref <18)

## 2020-11-27 NOTE — ED Triage Notes (Signed)
Pt reports shortness of breath with activity for about 7 months reports Chest pain for the past week, pt reports has had a cough for 7 months for past 3 weeks productive cough. Pt talks in complete sentences no respiratory distress noted

## 2020-11-28 ENCOUNTER — Emergency Department
Admission: EM | Admit: 2020-11-28 | Discharge: 2020-11-28 | Disposition: A | Discharge status: Home / Self Care | Payer: Medicaid Other | Attending: Emergency Medicine | Admitting: Emergency Medicine

## 2020-11-28 DIAGNOSIS — R0602 Shortness of breath: Secondary | ICD-10-CM

## 2020-11-28 DIAGNOSIS — R059 Cough, unspecified: Secondary | ICD-10-CM

## 2020-11-28 DIAGNOSIS — R079 Chest pain, unspecified: Secondary | ICD-10-CM

## 2020-11-28 LAB — RESP PANEL BY RT-PCR (FLU A&B, COVID) ARPGX2
Influenza A by PCR: NEGATIVE
Influenza B by PCR: NEGATIVE
SARS Coronavirus 2 by RT PCR: NEGATIVE

## 2020-11-28 LAB — TROPONIN I (HIGH SENSITIVITY): Troponin I (High Sensitivity): 2 ng/L (ref <18)

## 2020-11-28 NOTE — ED Provider Notes (Signed)
[facility] Emergency Department Provider Note  ____________________________________________   Event Date/Time   First MD Initiated Contact with Patient 11/28/20 [phone]     (approximate)  I have reviewed the triage vital signs and the nursing notes.   HISTORY  Chief Complaint Chest Pain   HPI Rachel Montes is a 33 y.o. female with a past medical history of Covid infection earlier this year in March who presents for assessment of approximately 1 week of some substernal chest discomfort associated with shortness of breath and cough over the last 7 months that have not improved with various inhalers prescribed by her PCP.  Patient states her symptoms not change acutely last 24 hours but she is not sure what is causing them.  She denies any fevers, chills, headache, earache, hemoptysis, nausea, vomiting, diarrhea, dysuria, rash, extremity pain or clear alleviating aggravating factors.  Her chest discomfort is not clearly positional or exertional and happens at rest as well as during regular daily activities.  She does not smoke.  No significant NSAID or EtOH use.  No recent estrogen supplementation.  She does not she is scheduled see a pulmonologist in January and was recently started on a new inhaler and a prednisone taper.         Past Medical History:  Diagnosis Date  . BRCA negative    Invitae panel neg  . Family history of breast cancer   . Family history of ovarian cancer   . Family history of throat cancer   . GBS bacteriuria 10/01/2017  . History of abnormal cervical Papanicolaou smear   . Increased risk of breast cancer    IBIS==33% per [employer]  . 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 Postpartum care following vaginal delivery 04/12/2018  . Postpartum hemorrhage 04/2018  . Shoulder dystocia during labor and delivery 04/2018  . SPROM (prolonged spontaneous rupture of membranes) 04/10/2018    Patient Active Problem List   Diagnosis Date Noted  .  At high risk for breast cancer 08/19/2020  . Genetic testing 01/24/2020  . Family history of breast cancer   . Family history of throat cancer   . PPH (postpartum hemorrhage) 07/06/2019  . Family history of ovarian cancer 07/06/2019  . Family history of breast cancer in mother 07/06/2019  . Shoulder dystocia during labor and delivery, delivered 04/12/2018  . Rh negative state in antepartum period 12/27/2017  . 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 COLPOSCOPY  12/2017  . LEEP      Prior to Admission medications   Medication Sig Start Date End Date Taking? Authorizing Provider  albuterol (VENTOLIN HFA) 108 (90 Base) MCG/ACT inhaler Inhale 1-2 puffs into the lungs every 6 (six) hours as needed for wheezing or shortness of breath. 11/06/20 12/06/20  Danton Clap, PA-C  CAMILA 0.35 MG tablet TAKE 1 TABLET BY MOUTH EVERY DAY 05/26/20   Dalia Heading, CNM    Allergies Symbicort [budesonide-formoterol fumarate]  Family History  Problem Relation Age of Onset  . Breast cancer Mother 88  . Ovarian cancer Paternal Grandmother 59  . Cervical cancer Paternal Aunt 83       has contact  . Ovarian cancer Paternal Aunt   . Skin cancer Father   . Cervical cancer Maternal Grandmother        dx 49s  . Throat cancer Maternal Grandfather   . Throat cancer Paternal  Grandfather     Social History Social History   Tobacco Use  . Smoking status: Never Smoker  . Smokeless tobacco: Never Used  Vaping Use  . Vaping Use: Never used  Substance Use Topics  . Alcohol use: Yes    Comment: rarely  . Drug use: No    Review of Systems  Review of Systems  Constitutional: Negative for chills and fever.  HENT: Negative for sore throat.   Eyes: Negative for pain.  Respiratory: Positive for cough and shortness of breath. Negative for stridor.   Cardiovascular: Positive for chest pain.   Gastrointestinal: Negative for vomiting.  Skin: Negative for rash.  Neurological: Negative for seizures, loss of consciousness and headaches.  Psychiatric/Behavioral: Negative for suicidal ideas.  All other systems reviewed and are negative.     ____________________________________________   PHYSICAL EXAM:  VITAL SIGNS: ED Triage Vitals  Enc Vitals Group     BP 11/27/20 2238 134/87     Pulse Rate 11/27/20 2238 88     Resp 11/27/20 2238 20     Temp 11/27/20 2238 98.7 F (37.1 C)     Temp Source 11/27/20 2238 Oral     SpO2 11/27/20 2238 100 %     Weight 11/27/20 2240 147 lb (66.7 kg)     Height 11/27/20 2240 $RemoveBefor'5\' 7"'[id number]$  (1.702 m)     Head Circumference --      Peak Flow --      Pain Score 11/27/20 2239 8     Pain Loc --      Pain Edu? --      Excl. in [place]? --    Vitals:   11/28/20 0116 11/28/20 0437  BP: 111/68 113/67  Pulse: 80 72  Resp: 20 18  Temp:    SpO2: 100% 99%   Physical Exam Vitals and nursing note reviewed.  Constitutional:      General: She is not in acute distress.    Appearance: She is well-developed and well-nourished.  HENT:     Head: Normocephalic and atraumatic.     Right Ear: External ear normal.     Left Ear: External ear normal.     Nose: Nose normal.     Mouth/Throat:     Mouth: Mucous membranes are moist.  Eyes:     Conjunctiva/sclera: Conjunctivae normal.  Cardiovascular:     Rate and Rhythm: Normal rate and regular rhythm.     Heart sounds: No murmur heard.   Pulmonary:     Effort: Pulmonary effort is normal. No respiratory distress.     Breath sounds: Normal breath sounds.  Abdominal:     Palpations: Abdomen is soft.     Tenderness: There is no abdominal tenderness.  Musculoskeletal:        General: No edema.     Cervical back: Neck supple.  Skin:    General: Skin is warm and dry.     Capillary Refill: Capillary refill takes less than 2 seconds.  Neurological:     Mental Status: She is alert and oriented to person, place, and  time.  Psychiatric:        Mood and Affect: Mood and affect and mood normal.      ____________________________________________   LABS (all labs ordered are listed, but only abnormal results are displayed)  Labs Reviewed  BASIC METABOLIC PANEL - Abnormal; Notable for the following components:      Result Value   Glucose, Bld 126 (*)    All other  components within normal limits  RESP PANEL BY RT-PCR (FLU A&B, COVID) ARPGX2  CBC  POC URINE PREG, ED  TROPONIN I (HIGH SENSITIVITY)  TROPONIN I (HIGH SENSITIVITY)   ____________________________________________  EKG  Sinus rhythm with significant artifact in the inferior lateral leads without any clear evidence of acute ischemia.  Unremarkable intervals.  Normal axis. ____________________________________________  RADIOLOGY  ED MD interpretation: No focal consolidation, pneumothorax, large effusion, significant edema, or other acute intrathoracic process.   Official radiology report(s): DG Chest 2 View  Result Date: 11/27/2020 CLINICAL DATA:  Chest pain and shortness of breath. EXAM: CHEST - 2 VIEW COMPARISON:  November 06, 2020 FINDINGS: The heart size and mediastinal contours are within normal limits. Both lungs are clear. The visualized skeletal structures are unremarkable. IMPRESSION: No active cardiopulmonary disease. Electronically Signed   By: Virgina Norfolk M.D.   On: 11/27/2020 23:24    ____________________________________________   PROCEDURES  Procedure(s) performed (including Critical Care):  Procedures   ____________________________________________   INITIAL IMPRESSION / ASSESSMENT AND PLAN / ED COURSE      Patient presents with above-stated history exam for assessment approximate 1 week of some anterior chest pain associated with some chronic cough and shortness of breath.  Patient's respiratory symptoms seem to have primarily started after she was diagnosed with Covid several months ago.  On arrival she  is afebrile hemodynamically stable.  Differential includes lung damage from Covid pneumonia, PE, ACS, arrhythmia, acute symptomatic anemia, pneumothorax, pneumonia, pericarditis and myocarditis.  Description of pain is less consistent with GI pathology.  While ECG has significant artifact or no clear ischemic findings and given 2 nonelevated troponins obtained over 2 hours have very low suspicion for ACS or myocarditis at this time.  Low suspicion for PE as patient is PERC negative.  No arrhythmia identified on ECG.  Patient is not acutely anemic on her CBC which is otherwise unremarkable.  BMP shows no significant ultralight or metabolic derangements.  Chest x-ray shows no evidence of pneumothorax, pneumonia, large effusion or other acute thoracic process  Unclear etiology for patient's symptoms although given stable vitals with otherwise reassuring exam and work-up low suspicion for immediate life-threatening pathology I believe patient is safe for discharge with plan for close outpatient PCP and pulmonology follow-up.  Discharged stable condition.  Strict return precautions provided and discussed.   ____________________________________________   FINAL CLINICAL IMPRESSION(S) / ED DIAGNOSES  Final diagnoses:  Chest pain, unspecified type  Cough  SOB (shortness of breath)    Medications - No data to display   ED Discharge Orders    None       Note:  This document was prepared using Dragon voice recognition software and may include unintentional dictation errors.   Lucrezia Starch, MD 11/28/20 [phone]

## 2020-12-01 ENCOUNTER — Emergency Department
Admission: EM | Admit: 2020-12-01 | Discharge: 2020-12-01 | Disposition: A | Discharge status: Home / Self Care | Payer: Medicaid Other | Attending: Emergency Medicine | Admitting: Emergency Medicine

## 2020-12-01 ENCOUNTER — Other Ambulatory Visit: Payer: Self-pay

## 2020-12-01 ENCOUNTER — Emergency Department: Payer: Medicaid Other

## 2020-12-01 ENCOUNTER — Encounter: Payer: Self-pay | Admitting: Emergency Medicine

## 2020-12-01 DIAGNOSIS — I309 Acute pericarditis, unspecified: Secondary | ICD-10-CM | POA: Insufficient documentation

## 2020-12-01 DIAGNOSIS — Z8616 Personal history of COVID-19: Secondary | ICD-10-CM | POA: Insufficient documentation

## 2020-12-01 DIAGNOSIS — R0781 Pleurodynia: Secondary | ICD-10-CM

## 2020-12-01 DIAGNOSIS — R079 Chest pain, unspecified: Secondary | ICD-10-CM | POA: Diagnosis not present

## 2020-12-01 DIAGNOSIS — R0789 Other chest pain: Secondary | ICD-10-CM | POA: Diagnosis not present

## 2020-12-01 DIAGNOSIS — R0602 Shortness of breath: Secondary | ICD-10-CM | POA: Diagnosis not present

## 2020-12-01 DIAGNOSIS — I2699 Other pulmonary embolism without acute cor pulmonale: Secondary | ICD-10-CM | POA: Diagnosis not present

## 2020-12-01 LAB — CBC
HCT: 37.3 % (ref 36.0–46.0)
Hemoglobin: 12.5 g/dL (ref 12.0–15.0)
MCH: 29.2 pg (ref 26.0–34.0)
MCHC: 33.5 g/dL (ref 30.0–36.0)
MCV: 87.1 fL (ref 80.0–100.0)
Platelets: 293 10*3/uL (ref 150–400)
RBC: 4.28 MIL/uL (ref 3.87–5.11)
RDW: 13.2 % (ref 11.5–15.5)
WBC: 9.5 10*3/uL (ref 4.0–10.5)
nRBC: 0 % (ref 0.0–0.2)

## 2020-12-01 LAB — BASIC METABOLIC PANEL
Anion gap: 8 (ref 5–15)
BUN: 10 mg/dL (ref 6–20)
CO2: 28 mmol/L (ref 22–32)
Calcium: 8.7 mg/dL — ABNORMAL LOW (ref 8.9–10.3)
Chloride: 104 mmol/L (ref 98–111)
Creatinine, Ser: 0.63 mg/dL (ref 0.44–1.00)
GFR, Estimated: >60 mL/min (ref >60)
Glucose, Bld: 96 mg/dL (ref 70–99)
Potassium: 3.3 mmol/L — ABNORMAL LOW (ref 3.5–5.1)
Sodium: 140 mmol/L (ref 135–145)

## 2020-12-01 LAB — TROPONIN I (HIGH SENSITIVITY)
Troponin I (High Sensitivity): 3 ng/L (ref <18)
Troponin I (High Sensitivity): <2 ng/L (ref <18)

## 2020-12-01 LAB — POC URINE PREG, ED: Preg Test, Ur: NEGATIVE

## 2020-12-01 MED ORDER — IOHEXOL 350 MG/ML SOLN
75.0000 mL | Freq: Once | INTRAVENOUS | Status: AC | PRN
  Administered 2020-12-01: 19:00:00 75 mL via INTRAVENOUS
  Filled 2020-12-01: qty 75

## 2020-12-01 MED ORDER — NAPROXEN 500 MG PO TABS: 500.0000 mg | ORAL_TABLET | Freq: Two times a day (BID) | ORAL | 0 refills | Status: AC

## 2020-12-01 NOTE — ED Notes (Signed)
Rainbow sent to the lab.  

## 2020-12-01 NOTE — ED Provider Notes (Signed)
[facility] Emergency Department Provider Note   ____________________________________________   Event Date/Time   First MD Initiated Contact with Patient 12/01/20 1751     (approximate)  I have reviewed the triage vital signs and the nursing notes.   HISTORY  Chief Complaint Chest Pain    HPI Rachel Montes is a 34 y.o. female with no significant past medical history who presents to the ED complaining of chest pain.  Patient reports that she has been dealing with chronic shortness of breath for the past 7 months ever since she was diagnosed with COVID-19.  For the past week and a half she has noticed increasing pain across her chest and back.  Pain wraps around the entirety of her chest and back and she describes it as sharp, exacerbated with any movements or a deep breath.  The pain seems to be worse when she goes to lie flat on her back and better when she sits up and leans forward.  She has not had any fevers or cough, denies any pain or swelling in her legs.  She does report a strong family history of DVT/PE and her PCP recommended that she get evaluated for PE.  She has been taking Tylenol occasionally for her pain without significant relief.  Her chronic shortness of breath is unchanged today.        Past Medical History:  Diagnosis Date  . BRCA negative    Invitae panel neg  . Family history of breast cancer   . Family history of ovarian cancer   . Family history of throat cancer   . GBS bacteriuria 10/01/2017  . History of abnormal cervical Papanicolaou smear   . Increased risk of breast cancer    IBIS==33% per [employer]  . 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 Postpartum care following vaginal delivery 04/12/2018  . Postpartum hemorrhage 04/2018  . Shoulder dystocia during labor and delivery 04/2018  . SPROM (prolonged spontaneous rupture of membranes) 04/10/2018    Patient Active Problem List   Diagnosis Date Noted  . At high risk for  breast cancer 08/19/2020  . Genetic testing 01/24/2020  . Family history of breast cancer   . Family history of throat cancer   . PPH (postpartum hemorrhage) 07/06/2019  . Family history of ovarian cancer 07/06/2019  . Family history of breast cancer in mother 07/06/2019  . Shoulder dystocia during labor and delivery, delivered 04/12/2018  . Rh negative state in antepartum period 12/27/2017  . 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 COLPOSCOPY  12/2017  . LEEP      Prior to Admission medications   Medication Sig Start Date End Date Taking? Authorizing Provider  naproxen (NAPROSYN) 500 MG tablet Take 1 tablet (500 mg total) by mouth 2 (two) times daily with a meal for 6 days. 12/01/20 12/07/20 Yes Blake Divine, MD  albuterol (VENTOLIN HFA) 108 (90 Base) MCG/ACT inhaler Inhale 1-2 puffs into the lungs every 6 (six) hours as needed for wheezing or shortness of breath. 11/06/20 12/06/20  Danton Clap, PA-C  CAMILA 0.35 MG tablet TAKE 1 TABLET BY MOUTH EVERY DAY 05/26/20   Dalia Heading, CNM    Allergies Symbicort [budesonide-formoterol fumarate]  Family History  Problem Relation Age of Onset  . Breast cancer Mother 3  . Ovarian cancer Paternal Grandmother 71  . Cervical cancer Paternal Aunt 70  has contact  . Ovarian cancer Paternal Aunt   . Skin cancer Father   . Cervical cancer Maternal Grandmother        dx 73s  . Throat cancer Maternal Grandfather   . Throat cancer Paternal Grandfather     Social History Social History   Tobacco Use  . Smoking status: Never Smoker  . Smokeless tobacco: Never Used  Vaping Use  . Vaping Use: Never used  Substance Use Topics  . Alcohol use: Yes    Comment: rarely  . Drug use: No    Review of Systems  Constitutional: No fever/chills Eyes: No visual changes. ENT: No sore throat. Cardiovascular: Positive for chest  pain. Respiratory: Positive for shortness of breath. Gastrointestinal: No abdominal pain.  No nausea, no vomiting.  No diarrhea.  No constipation. Genitourinary: Negative for dysuria. Musculoskeletal: Negative for back pain. Skin: Negative for rash. Neurological: Negative for headaches, focal weakness or numbness.  ____________________________________________   PHYSICAL EXAM:  VITAL SIGNS: ED Triage Vitals  Enc Vitals Group     BP 12/01/20 1500 106/63     Pulse Rate 12/01/20 1500 65     Resp 12/01/20 1500 18     Temp 12/01/20 1500 98.3 F (36.8 C)     Temp Source 12/01/20 1500 Oral     SpO2 12/01/20 1500 100 %     Weight 12/01/20 1528 147 lb 0.8 oz (66.7 kg)     Height 12/01/20 1528 $RemoveBefor'5\' 7"'[id number]$  (1.702 m)     Head Circumference --      Peak Flow --      Pain Score 12/01/20 1528 6     Pain Loc --      Pain Edu? --      Excl. in [place]? --     Constitutional: Alert and oriented. Eyes: Conjunctivae are normal. Head: Atraumatic. Nose: No congestion/rhinnorhea. Mouth/Throat: Mucous membranes are moist. Neck: Normal ROM Cardiovascular: Normal rate, regular rhythm. Grossly normal heart sounds.  2+ radial pulses bilaterally. Respiratory: Normal respiratory effort.  No retractions. Lungs CTAB.  Chest pain reproducible with palpation of anterior chest wall as well as palpation of her upper back. Gastrointestinal: Soft and nontender. No distention. Genitourinary: deferred Musculoskeletal: No lower extremity tenderness nor edema. Neurologic:  Normal speech and language. No gross focal neurologic deficits are appreciated. Skin:  Skin is warm, dry and intact. No rash noted. Psychiatric: Mood and affect are normal. Speech and behavior are normal.  ____________________________________________   LABS (all labs ordered are listed, but only abnormal results are displayed)  Labs Reviewed  BASIC METABOLIC PANEL - Abnormal; Notable for the following components:      Result Value   Potassium  3.3 (*)    Calcium 8.7 (*)    All other components within normal limits  CBC  POC URINE PREG, ED  TROPONIN I (HIGH SENSITIVITY)  TROPONIN I (HIGH SENSITIVITY)   ____________________________________________  EKG  ED ECG REPORT I, Blake Divine, the attending physician, personally viewed and interpreted this ECG.   Date: 12/01/2020  EKG Time: 15:03  Rate: 71  Rhythm: normal sinus rhythm  Axis: Normal  Intervals:none  ST&T Change: None   PROCEDURES  Procedure(s) performed (including Critical Care):  Procedures   ____________________________________________   INITIAL IMPRESSION / ASSESSMENT AND PLAN / ED COURSE       35 year old female with no significant past medical history presents to the ED complaining of worsening pleuritic chest pain for the past week and a half along with chronic  shortness of breath for the past 7 months after being diagnosed with COVID-19 in March.  Work-up thus far is unremarkable, EKG shows no evidence of arrhythmia or ischemia and 2 sets of troponin are negative, low suspicion for ACS.  Remainder of labs are also unremarkable.  We will further assess for PE with CTA chest, if this is negative I would also consider pericarditis with the sharp pain worse when she lays flat.  She was seen in the ED for similar symptoms a few days ago when testing for Covid is negative and her current symptoms do not sound infectious in etiology.  CTA chest is negative for PE or other acute pathology.  Given her pleuritic pain I suspect pericarditis versus costochondritis or other musculoskeletal pathology.  We will start patient on regular naproxen and she was provided with referral to cardiology for follow-up for this as well as her chronic shortness of breath.  She was counseled to follow-up with her PCP as well and return to the ED for new worsening symptoms, patient agrees with plan.      ____________________________________________   FINAL CLINICAL  IMPRESSION(S) / ED DIAGNOSES  Final diagnoses:  Pleuritic chest pain  Acute pericarditis, unspecified type     ED Discharge Orders         Ordered    naproxen (NAPROSYN) 500 MG tablet  2 times daily with meals        12/01/20 1927           Note:  This document was prepared using Dragon voice recognition software and may include unintentional dictation errors.   Blake Divine, MD 12/01/20 1929

## 2020-12-01 NOTE — ED Triage Notes (Signed)
C/O chest pain and SOB x 7 months.  States worse when laying down.  AAOx3.  Skin warm and dry. No SOB/ DOE.  NAD

## 2020-12-02 ENCOUNTER — Ambulatory Visit (INDEPENDENT_AMBULATORY_CARE_PROVIDER_SITE_OTHER): Payer: Medicaid Other | Admitting: Cardiovascular Disease

## 2020-12-02 ENCOUNTER — Encounter: Payer: Self-pay | Admitting: Cardiovascular Disease

## 2020-12-02 ENCOUNTER — Other Ambulatory Visit: Payer: Self-pay

## 2020-12-02 ENCOUNTER — Telehealth: Payer: Self-pay

## 2020-12-02 VITALS — BP 110/60 | HR 67 | Ht 67.0 in | Wt 153.1 lb

## 2020-12-02 DIAGNOSIS — U099 Post covid-19 condition, unspecified: Secondary | ICD-10-CM | POA: Diagnosis not present

## 2020-12-02 DIAGNOSIS — R0602 Shortness of breath: Secondary | ICD-10-CM | POA: Diagnosis not present

## 2020-12-02 DIAGNOSIS — R079 Chest pain, unspecified: Secondary | ICD-10-CM | POA: Diagnosis not present

## 2020-12-02 NOTE — Telephone Encounter (Signed)
Patient calling asking for a new patient appt.   Patient stated she has been to the ED 2 times this week.  Patient has SOB and now it is causing Chest pain that is moving up her neck.  Offered her the first available today with Dr. Mariah Milling - she declined due to having her 34 year old.   Patient is scheduled to see Dr. Okey Dupre 12/10/2020

## 2020-12-02 NOTE — Telephone Encounter (Signed)
Spoke with patient about appointments available and she states her husband came home and she would like to come today if that is still open. She lives 10 minutes away and would like to come on in. Placed her in DOD slot and made Dr. Okey Dupre aware as well. She verbalized understanding, was grateful for appointment, and had no further questions at this time.

## 2020-12-02 NOTE — Progress Notes (Signed)
Cardiology Office Note  Date:  12/02/2020   ID:  Rachel Montes, DOB 1986/05/07, MRN [id number]  PCP:  Rachel Sleeper, PA-C   Chief Complaint  Patient presents with  . Other    Chest pain, weakness, body soreness/numbness and sob. Meds reviewed verbally with pt.    HPI:  [name] is a 34 year old woman with past medical history of Covid May 2019 several recent trips to the emergency room in December for symptoms of shortness of breath cough palpitations chest pain Who presents by referral from Particia Nearing for consultation of her chest pain symptoms  Recent hospital records reviewed Urgent care November 06, 2020 cough shortness of breath Symptoms for 3 months, associated central chest pressure and palpitations Cardiac work-up unrevealing Was given albuterol inhaler  Emergency room November 28, 2020 chest pain 1 week substernal discomfort  In the emergency room December 01, 2020 chest pain Felt to be musculoskeletal in nature, given naproxen  On further discussion today reports having continued shortness of breath since diagnosis of Covid May 2021 Some coughing, nonproductive  More recently with some pain across her chest, flanks, into her back Exacerbated by movement Potassium 3.3 in the ER  CT scan chest negative for PE Images pulled up and reviewed  Given naproxen from the emergency room Does not feel as well on the naproxen, feels bruising in her jaw chest flanks legs  She does report some tenderness in her upper thoracic spine, flanks, extending into her mediastinum, reproducible with palpation, certain movements  EKG personally reviewed by myself on todays visit Normal sinus rhythm with rate 67 bpm no significant ST-T wave changes   PMH:   has a past medical history of BRCA negative, Family history of breast cancer, Family history of ovarian cancer, Family history of throat cancer, GBS bacteriuria (10/01/2017), History of abnormal cervical  Papanicolaou smear, Increased risk of breast cancer, 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  PSH:    Past Surgical History:  Procedure Laterality Date  . COLPOSCOPY  12/2017  . LEEP      Current Outpatient Medications  Medication Sig Dispense Refill  . CAMILA 0.35 MG tablet TAKE 1 TABLET BY MOUTH EVERY DAY 84 tablet 2  . COMBIVENT RESPIMAT 20-100 MCG/ACT AERS respimat Inhale 1 puff into the lungs 4 (four) times daily.    Marland Kitchen ELDERBERRY PO Take by mouth daily.    . Multiple Vitamins-Minerals (HAIR SKIN AND NAILS FORMULA PO) Take by mouth daily at 6 (six) AM.    . naproxen (NAPROSYN) 500 MG tablet Take 1 tablet (500 mg total) by mouth 2 (two) times daily with a meal for 6 days. 12 tablet 0  . Probiotic Product (PROBIOTIC PO) Take by mouth daily.    . Turmeric (QC TUMERIC COMPLEX PO) Take by mouth daily.     No current facility-administered medications for this visit.     Allergies:   Symbicort [budesonide-formoterol fumarate]   Social History:  The patient  reports that she has never smoked. She has never used smokeless tobacco. She reports current alcohol use. She reports that she does not use drugs.   Family History:   family history includes Breast cancer (age of onset: 66) in her mother; Cervical cancer in her maternal grandmother; Cervical cancer (age of onset: 62) in her paternal aunt; Ovarian cancer in her paternal aunt;  Ovarian cancer (age of onset: 56) in her paternal grandmother; Skin cancer in her father; Throat cancer in her maternal grandfather and paternal grandfather.    Review of Systems: Review of Systems  Constitutional: Negative.   HENT: Negative.   Respiratory: Positive for shortness of breath.   Cardiovascular: Positive for chest pain.  Gastrointestinal:  Negative.   Musculoskeletal: Negative.   Neurological: Negative.   Psychiatric/Behavioral: Negative.   All other systems reviewed and are negative.   PHYSICAL EXAM: VS:  BP 110/60 (BP Location: Right Arm, Patient Position: Sitting, Cuff Size: Normal)   Pulse 67   Ht $R'5\' 7"'aR$  (1.702 m)   Wt 153 lb 2 oz (69.5 kg)   LMP 11/17/2020 (Exact Date) Comment: neg upt  SpO2 99%   BMI 23.98 kg/m  , BMI Body mass index is 23.98 kg/m. GEN: Well nourished, well developed, in no acute distress HEENT: normal Neck: no JVD, carotid bruits, or masses Cardiac: RRR; no murmurs, rubs, or gallops,no edema  Respiratory:  clear to auscultation bilaterally, normal work of breathing GI: soft, nontender, nondistended, + BS MS: no deformity or atrophy Skin: warm and dry, no rash Neuro:  Strength and sensation are intact Psych: euthymic mood, full affect   Recent Labs: 11/06/2020: B Natriuretic Peptide 33.9; TSH 1.556 12/01/2020: BUN 10; Creatinine, Ser 0.63; Hemoglobin 12.5; Platelets 293; Potassium 3.3; Sodium 140    Lipid Panel No results found for: CHOL, HDL, LDLCALC, TRIG    Wt Readings from Last 3 Encounters:  12/02/20 153 lb 2 oz (69.5 kg)  12/01/20 147 lb 0.8 oz (66.7 kg)  11/27/20 147 lb (66.7 kg)       ASSESSMENT AND PLAN:  Problem List Items Addressed This Visit   None   Visit Diagnoses    Chest pain of uncertain etiology    -  Primary   Relevant Orders   EKG 12-Lead   Shortness of breath       Relevant Orders   ECHOCARDIOGRAM COMPLETE   COVID-19 long hauler         Chest pain 3 recent trips to urgent care/ER for chest discomfort Atypical in nature, reproducible on palpation extending from mediastinum to her flanks bilaterally to upper thoracic spine Certainly possible heavy coughing has caused some muscular ligamental strain Other differential includes costochondritis, Even pericarditis though less likely Not doing well on the naproxen, will change to ibuprofen which she  has taken before Recommend icing, rest, no heavy lifting, consider chiropractic, also massage May need muscle relaxer pill if symptoms persist  Shortness of breath Seem to present after Covid -Echocardiogram order has been placed for shortness of breath Recent CT scan chest unrevealing which is encouraging No signs of heart failure, normal EKG  Back discomfort Upper thoracic, extending to flanks bilaterally into the mediastinum Plan as above, suspect musculoskeletal Possibly exacerbated by heavy lifting or coughing  History of Covid Shortness of breath, echocardiogram ordered If normal echocardiogram could consider lung works program, even referral to pulmonary CT scan reassuring No improvement on inhalers    Total encounter time more than 60 minutes  Greater than 50% was spent in counseling and coordination of care with the patient  Patient was seen in consultation for Particia Nearing and will be referred back to her office for ongoing care of the issues detailed above    Signed, Esmond Plants, M.D., Ph.D. [place], [place]

## 2020-12-02 NOTE — Telephone Encounter (Signed)
ED w/u thus far has been reassuring.  She is welcome to f/u with me as scheduled next week or see me tomorrow in the DOD slot.  Yvonne Kendall, MD [facility]

## 2020-12-02 NOTE — Patient Instructions (Addendum)
Use cold compress (ice pack) for front (chest) and back to help reduce pain and inflammation  Consider chiropractic [address]k: [phone])  [facility] Address: [address] [place], [place] [address]    Medication Instructions:  Ibuprofen 1-2 (200-400mg ) every 6 hours as needed Skip naproxen   If you need a refill on your cardiac medications before your next appointment, please call your pharmacy.    Lab work: No new labs needed   If you have labs (blood work) drawn today and your tests are completely normal, you will receive your results only by: Marland Kitchen MyChart Message (if you have MyChart) OR . A paper copy in the mail If you have any lab test that is abnormal or we need to change your treatment, we will call you to review the results.   Testing/Procedures: Echo for shortness of breath  Your physician has requested that you have an echocardiogram. Echocardiography is a painless test that uses sound waves to create images of your heart. It provides your doctor with information about the size and shape of your heart and how well your heart's chambers and valves are working. This procedure takes approximately one hour. There are no restrictions for this procedure.  There is a possibility that an IV may need to be started during your test to inject an image enhancing agent. This is done to obtain more optimal pictures of your heart. Therefore we ask that you do at least drink some water prior to coming in to hydrate your veins.     Follow-Up: At [facility], you and your health needs are our priority.  As part of our continuing mission to provide you with exceptional heart care, we have created designated Provider Care Teams.  These Care Teams include your primary Cardiologist (physician) and Advanced Practice Providers (APPs -  Physician Assistants and Nurse Practitioners) who all work together to provide you with the care you need, when you need it.  . You will  need a follow up appointment as needed    (We will call you after your echo results are read by Dr. Mariah Milling)  . Providers on your designated Care Team:   . Nicolasa Ducking, NP . Eula Listen, PA-C . Marisue Ivan, PA-C  Any Other Special Instructions Will Be Listed Below (If Applicable).  COVID-19 Vaccine Information can be found at: [email or web address] For questions related to vaccine distribution or appointments, please email [email or web address] or call [phone].     Echocardiogram An echocardiogram is a procedure that uses painless sound waves (ultrasound) to produce an image of the heart. Images from an echocardiogram can provide important information about:  Signs of coronary artery disease (CAD).  Aneurysm detection. An aneurysm is a weak or damaged part of an artery wall that bulges out from the normal force of blood pumping through the body.  Heart size and shape. Changes in the size or shape of the heart can be associated with certain conditions, including heart failure, aneurysm, and CAD.  Heart muscle function.  Heart valve function.  Signs of a past heart attack.  Fluid buildup around the heart.  Thickening of the heart muscle.  A tumor or infectious growth around the heart valves. Tell a health care provider about:  Any allergies you have.  All medicines you are taking, including vitamins, herbs, eye drops, creams, and over-the-counter medicines.  Any blood disorders you have.  Any surgeries you have had.  Any medical conditions you have.  Whether you are pregnant or may be  pregnant. What are the risks? Generally, this is a safe procedure. However, problems may occur, including:  Allergic reaction to dye (contrast) that may be used during the procedure. What happens before the procedure? No specific preparation is needed. You may eat and drink normally. What happens during the  procedure?   An IV tube may be inserted into one of your veins.  You may receive contrast through this tube. A contrast is an injection that improves the quality of the pictures from your heart.  A gel will be applied to your chest.  A wand-like tool (transducer) will be moved over your chest. The gel will help to transmit the sound waves from the transducer.  The sound waves will harmlessly bounce off of your heart to allow the heart images to be captured in real-time motion. The images will be recorded on a computer. The procedure may vary among health care providers and hospitals. What happens after the procedure?  You may return to your normal, everyday life, including diet, activities, and medicines, unless your health care provider tells you not to do that. Summary  An echocardiogram is a procedure that uses painless sound waves (ultrasound) to produce an image of the heart.  Images from an echocardiogram can provide important information about the size and shape of your heart, heart muscle function, heart valve function, and fluid buildup around your heart.  You do not need to do anything to prepare before this procedure. You may eat and drink normally.  After the echocardiogram is completed, you may return to your normal, everyday life, unless your health care provider tells you not to do that. This information is not intended to replace advice given to you by your health care provider. Make sure you discuss any questions you have with your health care provider. Document Revised: 03/15/2019 Document Reviewed: 12/25/2016 Elsevier Patient Education  2020 [employer].

## 2020-12-02 NOTE — Telephone Encounter (Signed)
Spoke with patient and last May she had COVID and most recently she has persistently been short of breath and most recent having increased shortness of breath. She has also developed jaw pain, neck pain, and just feels like she has been beat up and "feels bruised". She also reports throat, back of neck, body is heavy "weighted down" and has been to the emergency room 3 times this month for various chest pain and shortness of breath. They did start her on Naproxen 500 mg twice daily for 6 days. She states that last night she started this and she slept better. She does now report numbness in hands as well. Reviewed all visits to ED and she is scheduled to come in next week to see Dr. Okey Dupre. Advised that I would add on our wait list if someone cancels then we can give her a call. She verbalized understanding of our conversation with no further questions at this time.

## 2020-12-02 NOTE — Telephone Encounter (Signed)
Left voicemail message to call back  

## 2020-12-02 NOTE — Telephone Encounter (Signed)
Left voicemail message for patient to call back.

## 2020-12-10 ENCOUNTER — Ambulatory Visit: Payer: BLUE CROSS/BLUE SHIELD | Admitting: Internal Medicine

## 2020-12-11 ENCOUNTER — Other Ambulatory Visit: Payer: Self-pay

## 2020-12-11 DIAGNOSIS — R200 Anesthesia of skin: Secondary | ICD-10-CM | POA: Diagnosis not present

## 2020-12-11 DIAGNOSIS — R079 Chest pain, unspecified: Secondary | ICD-10-CM | POA: Diagnosis not present

## 2020-12-11 DIAGNOSIS — R059 Cough, unspecified: Secondary | ICD-10-CM | POA: Diagnosis not present

## 2020-12-11 DIAGNOSIS — R0602 Shortness of breath: Secondary | ICD-10-CM | POA: Insufficient documentation

## 2020-12-11 DIAGNOSIS — Z20822 Contact with and (suspected) exposure to covid-19: Secondary | ICD-10-CM | POA: Diagnosis not present

## 2020-12-11 DIAGNOSIS — Z8616 Personal history of COVID-19: Secondary | ICD-10-CM | POA: Insufficient documentation

## 2020-12-11 DIAGNOSIS — R197 Diarrhea, unspecified: Secondary | ICD-10-CM | POA: Diagnosis not present

## 2020-12-12 ENCOUNTER — Emergency Department: Payer: Medicaid Other

## 2020-12-12 ENCOUNTER — Emergency Department
Admission: EM | Admit: 2020-12-12 | Discharge: 2020-12-12 | Disposition: A | Discharge status: Home / Self Care | Payer: Medicaid Other | Attending: Emergency Medicine | Admitting: Emergency Medicine

## 2020-12-12 ENCOUNTER — Encounter: Payer: Self-pay | Admitting: *Deleted

## 2020-12-12 DIAGNOSIS — R079 Chest pain, unspecified: Secondary | ICD-10-CM

## 2020-12-12 LAB — CBC
HCT: 38.5 % (ref 36.0–46.0)
Hemoglobin: 12.9 g/dL (ref 12.0–15.0)
MCH: 29.1 pg (ref 26.0–34.0)
MCHC: 33.5 g/dL (ref 30.0–36.0)
MCV: 86.7 fL (ref 80.0–100.0)
Platelets: 288 10*3/uL (ref 150–400)
RBC: 4.44 MIL/uL (ref 3.87–5.11)
RDW: 13.1 % (ref 11.5–15.5)
WBC: 14 10*3/uL — ABNORMAL HIGH (ref 4.0–10.5)
nRBC: 0 % (ref 0.0–0.2)

## 2020-12-12 LAB — BASIC METABOLIC PANEL
Anion gap: 9 (ref 5–15)
BUN: 9 mg/dL (ref 6–20)
CO2: 24 mmol/L (ref 22–32)
Calcium: 9.4 mg/dL (ref 8.9–10.3)
Chloride: 106 mmol/L (ref 98–111)
Creatinine, Ser: 0.53 mg/dL (ref 0.44–1.00)
GFR, Estimated: >60 mL/min (ref >60)
Glucose, Bld: 104 mg/dL — ABNORMAL HIGH (ref 70–99)
Potassium: 3.6 mmol/L (ref 3.5–5.1)
Sodium: 139 mmol/L (ref 135–145)

## 2020-12-12 LAB — SARS CORONAVIRUS 2 (TAT 6-24 HRS): SARS Coronavirus 2: NEGATIVE

## 2020-12-12 LAB — TROPONIN I (HIGH SENSITIVITY)
Troponin I (High Sensitivity): 4 ng/L (ref <18)
Troponin I (High Sensitivity): 3 ng/L (ref <18)

## 2020-12-12 NOTE — ED Provider Notes (Signed)
[facility] Emergency Department Provider Note   ____________________________________________   Event Date/Time   First MD Initiated Contact with Patient 12/12/20 0315     (approximate)  I have reviewed the triage vital signs and the nursing notes.   HISTORY  Chief Complaint Chest Pain    HPI Rachel Montes is a 35 y.o. female with no significant past medical history who presents to the ED complaining of chest pain.  Patient reports that she was sitting and watching TV with her family when she had sudden onset of pain in her left chest radiating down her left arm.  She describes the pain as sharp and severe for about 4 to 5 minutes, after which it has eased off.  It was associated with some numbness and tingling in her left arm, which is also improving.  She denies any weakness in her extremities.  She has dealt with chronic cough and shortness of breath since being diagnosed to COVID-19 last May, has follow-up scheduled with pulmonology later this month.  She has not had any worsening cough or shortness of breath with this episode of chest pain.  She does state that her husband tested positive for COVID-19 within the past week and she has had some diarrhea.  She is requesting to be tested for COVID-19.        Past Medical History:  Diagnosis Date  . BRCA negative    [employer] panel neg  . Family history of breast cancer   . Family history of ovarian cancer   . Family history of throat cancer   . GBS bacteriuria 10/01/2017  . History of abnormal cervical Papanicolaou smear   . Increased risk of breast cancer    IBIS==33% per [employer]  . 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 Postpartum care following vaginal delivery 04/12/2018  . Postpartum hemorrhage 04/2018  . Shoulder dystocia during labor and delivery 04/2018  . SPROM (prolonged spontaneous rupture of membranes) 04/10/2018    Patient Active Problem List   Diagnosis Date Noted  . At high risk for  breast cancer 08/19/2020  . Genetic testing 01/24/2020  . Family history of breast cancer   . Family history of throat cancer   . PPH (postpartum hemorrhage) 07/06/2019  . Family history of ovarian cancer 07/06/2019  . Family history of breast cancer in mother 07/06/2019  . Shoulder dystocia during labor and delivery, delivered 04/12/2018  . Rh negative state in antepartum period 12/27/2017  . 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 COLPOSCOPY  12/2017  . LEEP      Prior to Admission medications   Medication Sig Start Date End Date Taking? Authorizing Provider  CAMILA 0.35 MG tablet TAKE 1 TABLET BY MOUTH EVERY DAY 05/26/20   Dalia Heading, CNM  COMBIVENT RESPIMAT 20-100 MCG/ACT AERS respimat Inhale 1 puff into the lungs 4 (four) times daily. 11/26/20   [provider]  ELDERBERRY PO Take by mouth daily.    [provider]  Multiple Vitamins-Minerals (HAIR SKIN AND NAILS FORMULA PO) Take by mouth daily at 6 (six) AM.    [provider]  Probiotic Product (PROBIOTIC PO) Take by mouth daily.    [provider]  Turmeric (QC TUMERIC COMPLEX PO) Take by mouth daily.    [provider]    Allergies Symbicort [budesonide-formoterol fumarate]  Family History  Problem Relation Age of  Onset  . Breast cancer Mother 42  . Ovarian cancer Paternal Grandmother 55  . Cervical cancer Paternal Aunt 56       has contact  . Ovarian cancer Paternal Aunt   . Skin cancer Father   . Cervical cancer Maternal Grandmother        dx 99s  . Throat cancer Maternal Grandfather   . Throat cancer Paternal Grandfather     Social History Social History   Tobacco Use  . Smoking status: Never Smoker  . Smokeless tobacco: Never Used  Vaping Use  . Vaping Use: Never used  Substance Use Topics  . Alcohol use: Yes    Comment: rarely  . Drug use: No     Review of Systems  Constitutional: No fever/chills Eyes: No visual changes. ENT: No sore throat. Cardiovascular: Positive for chest pain. Respiratory: Denies shortness of breath. Gastrointestinal: No abdominal pain.  No nausea, no vomiting.  No diarrhea.  No constipation. Genitourinary: Negative for dysuria. Musculoskeletal: Negative for back pain. Skin: Negative for rash. Neurological: Negative for headaches or focal weakness, positive for numbness and tingling.  ____________________________________________   PHYSICAL EXAM:  VITAL SIGNS: ED Triage Vitals  Enc Vitals Group     BP 12/12/20 0025 119/73     Pulse Rate 12/12/20 0025 98     Resp 12/12/20 0025 16     Temp 12/12/20 0025 98.4 F (36.9 C)     Temp Source 12/12/20 0025 Oral     SpO2 12/12/20 0025 100 %     Weight 12/12/20 0034 153 lb 3.5 oz (69.5 kg)     Height 12/12/20 0034 $RemoveBefor'5\' 7"'RacBewPwGupb$  (1.702 m)     Head Circumference --      Peak Flow --      Pain Score 12/12/20 0034 10     Pain Loc --      Pain Edu? --      Excl. in [place]? --     Constitutional: Alert and oriented. Eyes: Conjunctivae are normal. Head: Atraumatic. Nose: No congestion/rhinnorhea. Mouth/Throat: Mucous membranes are moist. Neck: Normal ROM Cardiovascular: Normal rate, regular rhythm. Grossly normal heart sounds.  2+ radial pulses bilaterally. Respiratory: Normal respiratory effort.  No retractions. Lungs CTAB. Gastrointestinal: Soft and nontender. No distention. Genitourinary: deferred Musculoskeletal: No lower extremity tenderness nor edema. Neurologic:  Normal speech and language. No gross focal neurologic deficits are appreciated. Skin:  Skin is warm, dry and intact. No rash noted. Psychiatric: Mood and affect are normal. Speech and behavior are normal.  ____________________________________________   LABS (all labs ordered are listed, but only abnormal results are displayed)  Labs Reviewed  BASIC METABOLIC PANEL - Abnormal; Notable for  the following components:      Result Value   Glucose, Bld 104 (*)    All other components within normal limits  CBC - Abnormal; Notable for the following components:   WBC 14.0 (*)    All other components within normal limits  SARS CORONAVIRUS 2 (TAT 6-24 HRS)  POC URINE PREG, ED  TROPONIN I (HIGH SENSITIVITY)  TROPONIN I (HIGH SENSITIVITY)   ____________________________________________  EKG  ED ECG REPORT I, Blake Divine, the attending physician, personally viewed and interpreted this ECG.   Date: 12/12/2020  EKG Time: 00:11  Rate: 90  Rhythm: normal sinus rhythm  Axis: Normal  Intervals:none  ST&T Change: None   PROCEDURES  Procedure(s) performed (including Critical Care):  Procedures   ____________________________________________   INITIAL IMPRESSION / ASSESSMENT AND PLAN / ED  COURSE       35 year old female with no significant past medical history presents to the ED with sudden onset chest pain radiating down her left arm.  Symptoms are now much improved and she is neurovascularly intact to her left upper extremity.  EKG shows no evidence of arrhythmia or ischemia and initial troponin is negative.  I doubt PE as patient is PERC negative, she also presented to the ED for similar symptoms within the past 2 weeks, when CTA chest was negative for PE or other acute process.  Remainder of labs are unremarkable, chest x-ray reviewed by me and shows no infiltrate, edema, or effusion.  If repeat troponin is within normal limits, I doubt ACS as patient has heart score of less than 4.  Repeat troponin within normal limits and patient denies any recurrence of significant chest pain.  She is appropriate for discharge home and has pulmonology follow-up scheduled for later this month for her chronic shortness of breath.  She was counseled to return to the ED for any new or worsening symptoms, patient agrees with plan.       ____________________________________________   FINAL CLINICAL IMPRESSION(S) / ED DIAGNOSES  Final diagnoses:  Nonspecific chest pain     ED Discharge Orders    None       Note:  This document was prepared using Dragon voice recognition software and may include unintentional dictation errors.   Blake Divine, MD 12/12/20 [id number]

## 2020-12-12 NOTE — ED Notes (Signed)
PT ambulatory at time of discharge and in NAD. No questions.

## 2020-12-12 NOTE — ED Triage Notes (Signed)
Pt to ED reporting she was sitting watching a movie when she had sudden onset of left sided chest pain radiating to her back and down her left arm. Pain has subsided but the numbness persists. Pt was diaphoretic and SOB with lightheadedness while in pain but appears stable and in NAD at this time. No cardiac or pulmonary hx. PT also reporting her BP was elevated when in pain.   Pt recently exposed to COVID but tested negative.

## 2020-12-24 ENCOUNTER — Encounter: Payer: Self-pay | Admitting: Advanced Practice Midwife

## 2020-12-24 ENCOUNTER — Ambulatory Visit (INDEPENDENT_AMBULATORY_CARE_PROVIDER_SITE_OTHER): Payer: Medicaid Other | Admitting: Advanced Practice Midwife

## 2020-12-24 ENCOUNTER — Other Ambulatory Visit: Payer: Self-pay

## 2020-12-24 ENCOUNTER — Other Ambulatory Visit (HOSPITAL_COMMUNITY)
Admission: RE | Admit: 2020-12-24 | Discharge: 2020-12-24 | Disposition: A | Discharge status: Home / Self Care | Payer: Medicaid Other | Source: Ambulatory Visit | Attending: Advanced Practice Midwife | Admitting: Advanced Practice Midwife

## 2020-12-24 VITALS — BP 122/78 | HR 91 | Wt 155.0 lb

## 2020-12-24 DIAGNOSIS — Z113 Encounter for screening for infections with a predominantly sexual mode of transmission: Secondary | ICD-10-CM

## 2020-12-24 DIAGNOSIS — Z3A01 Less than 8 weeks gestation of pregnancy: Secondary | ICD-10-CM

## 2020-12-24 DIAGNOSIS — O0991 Supervision of high risk pregnancy, unspecified, first trimester: Secondary | ICD-10-CM

## 2020-12-24 DIAGNOSIS — O09521 Supervision of elderly multigravida, first trimester: Secondary | ICD-10-CM | POA: Insufficient documentation

## 2020-12-24 DIAGNOSIS — N926 Irregular menstruation, unspecified: Secondary | ICD-10-CM

## 2020-12-24 HISTORY — DX: Supervision of high risk pregnancy, unspecified, first trimester: O09.91

## 2020-12-24 LAB — POCT URINE PREGNANCY: Preg Test, Ur: POSITIVE — AB

## 2020-12-24 NOTE — Patient Instructions (Signed)
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
## 2020-12-26 ENCOUNTER — Telehealth: Payer: Self-pay

## 2020-12-26 LAB — URINE CULTURE

## 2020-12-26 LAB — CERVICOVAGINAL ANCILLARY ONLY
Chlamydia: NEGATIVE
Comment: NEGATIVE
Comment: NEGATIVE
Comment: NORMAL
Neisseria Gonorrhea: NEGATIVE
Trichomonas: NEGATIVE

## 2020-12-26 NOTE — Telephone Encounter (Signed)
Spoke w/patient. Advised to monitor and call us or report to ED if pain is unrelieved by tylenol or severe enough to double her over. Tylenol for headache and caffeine may help. Remain well hydrated.

## 2020-12-26 NOTE — Telephone Encounter (Signed)
Patient reports some right side pain during the night and this morning. It has subsided now. She also is inquiring what she can take for headache. [phone]

## 2020-12-30 ENCOUNTER — Ambulatory Visit (INDEPENDENT_AMBULATORY_CARE_PROVIDER_SITE_OTHER): Payer: Medicaid Other | Admitting: Pulmonary Disease

## 2020-12-30 ENCOUNTER — Encounter: Payer: Self-pay | Admitting: Pulmonary Disease

## 2020-12-30 ENCOUNTER — Other Ambulatory Visit: Payer: Medicaid Other

## 2020-12-30 ENCOUNTER — Other Ambulatory Visit: Payer: Self-pay

## 2020-12-30 VITALS — BP 124/76 | HR 96 | Temp 97.1°F | Ht 67.0 in | Wt 153.4 lb

## 2020-12-30 DIAGNOSIS — R0602 Shortness of breath: Secondary | ICD-10-CM

## 2020-12-30 DIAGNOSIS — Z3A01 Less than 8 weeks gestation of pregnancy: Secondary | ICD-10-CM

## 2020-12-30 DIAGNOSIS — Z8616 Personal history of COVID-19: Secondary | ICD-10-CM | POA: Diagnosis not present

## 2020-12-30 NOTE — Patient Instructions (Signed)
We are going to get breathing tests.  Keep your appointment for the echo test (heart test) that Dr. Mariah Milling has ordered.  See you in follow-up in 4 to 6 weeks time call sooner should any new problems arise.

## 2020-12-30 NOTE — Progress Notes (Signed)
Subjective:    Patient ID: Rachel Montes, female    DOB: [birth year], 35 y.o.   MRN: [id number]  HPI 35 year old lifelong never smoker, in 6 weeks of gestation, who presents for evaluation of dyspnea following COVID-19 infection in May 2021.  Patient is kindly referred by Particia Nearing, PA-C.  The patient states that she sometimes also notes chest discomfort with the shortness of breath.  Shortness of breath is noted doing everyday activities and these are becoming more difficult for the patient.  She notes taking slow deep breaths makes her symptoms better.  Steam in the shower sometimes helps.  She notes that moving very quickly and stress can make her symptoms worse.  She was given a trial of Symbicort and this did not help any and gave her some bothersome side effects (tingling of fingers) so she discontinued it.  She occasionally notes some globus sensation but no frank reflux.  No fevers, chills or sweats.  She has been evaluated by cardiology and is to have an upcoming 2D echo.  She has no orthopnea, paroxysmal nocturnal dyspnea nor lower extremity edema.  She has had palpitations which are being evaluated by cardiology.  Chest CT in December was performed due to 1 week history of substernal chest discomfort.  She has resided previously in [place], [place] and [place].  No military history.  No family history of respiratory diseases, patient does not have a history of asthma as a child.   Review of Systems A 10 point review of systems was performed and it is as noted above otherwise negative.  Past Medical History:  Diagnosis Date  . BRCA negative    [facility] panel neg  . Family history of breast cancer   . Family history of ovarian cancer   . Family history of throat cancer   . GBS bacteriuria 10/01/2017  . History of abnormal cervical Papanicolaou smear   . Increased risk of breast cancer    IBIS==33% per [facility]  . 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 Postpartum care  following vaginal delivery 04/12/2018  . Postpartum hemorrhage 04/2018  . Shoulder dystocia during labor and delivery 04/2018  . SPROM (prolonged spontaneous rupture of membranes) 04/10/2018   Past Surgical History:  Procedure Laterality Date  . COLPOSCOPY  12/2017  . LEEP     Family History  Problem Relation Age of Onset  . Breast cancer Mother 32  . Ovarian cancer Paternal Grandmother 42  . Cervical cancer Paternal Aunt 20       has contact  . Ovarian cancer Paternal Aunt   . Skin cancer Father   . Cervical cancer Maternal Grandmother        dx 30s  . Throat cancer Maternal Grandfather   . Throat cancer Paternal Grandfather    Social History   Tobacco Use  . Smoking status: Never Smoker  . Smokeless tobacco: Never Used  Substance Use Topics  . Alcohol use: Yes    Comment: rarely   Allergies  Allergen Reactions  . Symbicort [Budesonide-Formoterol Fumarate]    Current Meds  Medication Sig  . ELDERBERRY PO Take by mouth daily.  . Prenatal Vit-Fe Fumarate-FA (PRENATAL VITAMINS PO) Take by mouth.  . Probiotic Product (PROBIOTIC PO) Take by mouth daily.  . Turmeric (QC TUMERIC COMPLEX PO) Take by mouth daily.  . [DISCONTINUED] Multiple Vitamins-Minerals (HAIR SKIN AND NAILS FORMULA PO) Take by mouth daily at 6 (six) AM.   Immunization History  Administered Date(s) Administered  .  Tdap 02/07/2018       Objective:   Physical Exam BP 124/76 (BP Location: Left Arm, Cuff Size: Normal)   Pulse 96   Temp (!) 97.1 F (36.2 C) (Temporal)   Ht $R'5\' 7"'jy$  (1.702 m)   Wt 153 lb 6.4 oz (69.6 kg)   LMP 11/16/2020 (Exact Date) Comment: neg upt  SpO2 99%   BMI 24.03 kg/m  GENERAL: Well developed well nourished woman in no acute distress, fully ambulatory.  No conversational dyspnea. HEAD: Normocephalic, atraumatic.  EYES: Pupils equal, round, reactive to light.  No scleral icterus.  MOUTH: Nose/mouth/throat not examined due to masking requirements for COVID 19. NECK: Supple. No  thyromegaly. Trachea midline. No JVD.  No adenopathy. PULMONARY: Good air entry bilaterally.  No adventitious sounds. CARDIOVASCULAR: S1 and S2. Regular rate and rhythm.  No rubs, murmurs or gallops heard. ABDOMEN: Gravid. MUSCULOSKELETAL: No joint deformity, no clubbing, no edema.  NEUROLOGIC: No overt focal deficit, no gait disturbance, speech is fluent SKIN: Intact,warm,dry.  On limited exam no rashes PSYCH: Mood and behavior normal.  Percentage of slice of CT performed 01 December 2020, independently reviewed, no parenchymal abnormalities, CT CT revealed no PE:   Chest x-ray performed 12/12/2020, independently reviewed, mild hyperinflation otherwise benign:   Assessment & Plan:     ICD-10-CM   1. 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 Personal history of COVID-19  Z86.16    This issue adds complexity to her management Dyspnea is a persistent symptom post COVID Not necessarily associated with pulmonary fibrosis  3. Less than [redacted] weeks gestation of pregnancy  Z3A.01    This issue adds complexity to her management   Orders Placed This Encounter  Procedures  . Pulmonary Function Test [facility] Only    Standing Status:   Future    Standing Expiration Date:   12/30/2021    Scheduling Instructions:     Next available.    Order Specific Question:   Full PFT: includes the following: basic spirometry, spirometry pre & post bronchodilator, diffusion capacity (DLCO), lung volumes    Answer:   Full PFT   Discussion:  Dyspnea can be a lingering symptom post COVID.  This is even without evidence of postinflammatory pulmonary fibrosis.  The exact mechanism is as yet, unknown.  Will obtain PFTs for evaluation.  The patient has an upcoming 2D echo which will also provide useful information as myocarditis is potential sequela from COVID. We will see the patient  in follow-up 4 to 6 weeks time, she is to contact us prior to that time should any new problems arise.  Renold Don, MD [facility]   *This note was dictated using voice recognition software/Dragon.  Despite best efforts to proofread, errors can occur which can change the meaning.  Any change was purely unintentional.

## 2021-01-02 ENCOUNTER — Encounter: Payer: Medicaid Other | Admitting: Obstetrics

## 2021-01-02 ENCOUNTER — Ambulatory Visit (INDEPENDENT_AMBULATORY_CARE_PROVIDER_SITE_OTHER): Payer: Medicaid Other | Admitting: Obstetrics and Gynecology

## 2021-01-02 ENCOUNTER — Other Ambulatory Visit: Payer: Self-pay | Admitting: Obstetrics and Gynecology

## 2021-01-02 ENCOUNTER — Other Ambulatory Visit: Payer: Medicaid Other

## 2021-01-02 ENCOUNTER — Other Ambulatory Visit: Payer: Self-pay

## 2021-01-02 ENCOUNTER — Other Ambulatory Visit (INDEPENDENT_AMBULATORY_CARE_PROVIDER_SITE_OTHER): Payer: Medicaid Other

## 2021-01-02 ENCOUNTER — Encounter: Payer: Self-pay | Admitting: Obstetrics and Gynecology

## 2021-01-02 VITALS — BP 114/70 | Ht 67.0 in | Wt 148.2 lb

## 2021-01-02 DIAGNOSIS — Z3491 Encounter for supervision of normal pregnancy, unspecified, first trimester: Secondary | ICD-10-CM | POA: Diagnosis not present

## 2021-01-02 DIAGNOSIS — R109 Unspecified abdominal pain: Secondary | ICD-10-CM

## 2021-01-02 DIAGNOSIS — Z3A01 Less than 8 weeks gestation of pregnancy: Secondary | ICD-10-CM

## 2021-01-02 NOTE — Progress Notes (Signed)
Patient ID: Rachel Montes, female   DOB: [birth year], 35 y.o.   MRN: [id number]  Reason for Consult: Routine Prenatal Visit (Pain in her left side. X 2 weeks)   Referred by Terald Sleeper, PA-C  Subjective:     HPI:  Rachel Montes is a 35 y.o. female. She reports left sided pain for 2 weeks. She denies vaginal bleeding. She is recently pregnant. She denies vaginal bleeding today but reports that last week she had small amounts of spotting.    Past Medical History:  Diagnosis Date  . BRCA negative    [facility] panel neg  . Family history of breast cancer   . Family history of ovarian cancer   . Family history of throat cancer   . GBS bacteriuria 10/01/2017  . History of abnormal cervical Papanicolaou smear   . Increased risk of breast cancer    IBIS==33% per [facility]  . 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 Postpartum care following vaginal delivery 04/12/2018  . Postpartum hemorrhage 04/2018  . Shoulder dystocia during labor and delivery 04/2018  . SPROM (prolonged spontaneous rupture of membranes) 04/10/2018   Family History  Problem Relation Age of Onset  . Breast cancer Mother 55  . Ovarian cancer Paternal Grandmother 78  . Cervical cancer Paternal Aunt 24       has contact  . Ovarian cancer Paternal Aunt   . Skin cancer Father   . Cervical cancer Maternal Grandmother        dx 91s  . Throat cancer Maternal Grandfather   . Throat cancer Paternal Grandfather    Past Surgical History:  Procedure Laterality Date  . COLPOSCOPY  12/2017  . LEEP      Short Social History:  Social History   Tobacco Use  . Smoking status: Never Smoker  . Smokeless tobacco: Never Used  Substance Use Topics  . Alcohol use: Yes    Comment: rarely    Allergies  Allergen Reactions  . Symbicort [Budesonide-Formoterol Fumarate]     Current Outpatient Medications  Medication Sig Dispense Refill  . ELDERBERRY PO Take by mouth daily.    . Prenatal Vit-Fe Fumarate-FA (PRENATAL  VITAMINS PO) Take by mouth.    . Probiotic Product (PROBIOTIC PO) Take by mouth daily.    . Turmeric (QC TUMERIC COMPLEX PO) Take by mouth daily.    . Cholecalciferol 75 MCG (3000 UT) TABS  (Patient not taking: Reported on 01/02/2021)     No current facility-administered medications for this visit.    Review of Systems  Constitutional: Negative for chills, fatigue, fever and unexpected weight change.  HENT: Negative for trouble swallowing.  Eyes: Negative for loss of vision.  Respiratory: Negative for cough, shortness of breath and wheezing.  Cardiovascular: Negative for chest pain, leg swelling, palpitations and syncope.  GI: Negative for abdominal pain, blood in stool, diarrhea, nausea and vomiting.  GU: Negative for difficulty urinating, dysuria, frequency and hematuria.  Musculoskeletal: Negative for back pain, leg pain and joint pain.  Skin: Negative for rash.  Neurological: Negative for dizziness, headaches, light-headedness, numbness and seizures.  Psychiatric: Negative for behavioral problem, confusion, depressed mood and sleep disturbance.        Objective:  Objective   Vitals:   01/02/21 1439  BP: 114/70  Weight: 148 lb 3.2 oz (67.2 kg)  Height: $Remove'5\' 7"'KjPIJGh$  (1.702 m)   Body mass index is 23.21 kg/m.  Physical Exam Vitals and nursing note reviewed.  Constitutional:  Appearance: She is well-developed and well-nourished.  HENT:     Head: Normocephalic and atraumatic.  Eyes:     Extraocular Movements: EOM normal.     Pupils: Pupils are equal, round, and reactive to light.  Cardiovascular:     Rate and Rhythm: Normal rate and regular rhythm.  Pulmonary:     Effort: Pulmonary effort is normal. No respiratory distress.  Skin:    General: Skin is warm and dry.  Neurological:     Mental Status: She is alert and oriented to person, place, and time.  Psychiatric:        Mood and Affect: Mood and affect normal.        Behavior: Behavior normal.        Thought  Content: Thought content normal.        Judgment: Judgment normal.     Assessment/Plan:     35 yo with left sided lower abdominal pain.  Discussed [place] results. Normal IUP on today's [place].  Will follow up in 2 weeks for ROB, TVUS and NOB panel.  Okay to take tylenol 1000 mg every 6 hours PRN pain.  Discussed amount of caffeine which is recommended to consume during pregnancy.  More than 20 minutes were spent face to face with the patient in the room, reviewing the medical record, labs and images, and coordinating care for the patient. The plan of management was discussed in detail and counseling was provided.     Adrian Prows MD [facility] 01/02/2021 3:13 PM

## 2021-01-02 NOTE — Telephone Encounter (Signed)
Pt left msg on triage saying she is having consistent/more days left side pelvic pain. How normal can this be? Has ROB/Dating Scan 01/07/21. Please advise.

## 2021-01-02 NOTE — Progress Notes (Signed)
Pt states she has left side pain x 2 week . Pt states she has had some bleeding.

## 2021-01-02 NOTE — Patient Instructions (Signed)
Obstetrics: Normal and Problem Pregnancies (7th ed., pp. 102-121). [place], [place]: Elsevier."> Textbook of Family Medicine (9th ed., pp. 365-410). [place], [place]: [employer].">  First Trimester of Pregnancy  The first trimester of pregnancy starts on the first day of your last menstrual period until the end of week 12. This is months 1 through 3 of pregnancy. A week after a sperm fertilizes an egg, the egg will implant into the wall of the uterus and begin to develop into a baby. By the end of 12 weeks, all the baby's organs will be formed and the baby will be 2-3 inches in size. Body changes during your first trimester Your body goes through many changes during pregnancy. The changes vary and generally return to normal after your baby is born. Physical changes  You may gain or lose weight.  Your breasts may begin to grow larger and become tender. The tissue that surrounds your nipples (areola) may become darker.  Dark spots or blotches (chloasma or mask of pregnancy) may develop on your face.  You may have changes in your hair. These can include thickening or thinning of your hair or changes in texture. Health changes  You may feel nauseous, and you may vomit.  You may have heartburn.  You may develop headaches.  You may develop constipation.  Your gums may bleed and may be sensitive to brushing and flossing. Other changes  You may tire easily.  You may urinate more often.  Your menstrual periods will stop.  You may have a loss of appetite.  You may develop cravings for certain kinds of food.  You may have changes in your emotions from day to day.  You may have more vivid and strange dreams. Follow these instructions at home: Medicines  Follow your health care provider's instructions regarding medicine use. Specific medicines may be either safe or unsafe to take during pregnancy. Do not take any medicines unless told to by your health care provider.  Take a  prenatal vitamin that contains at least 600 micrograms (mcg) of folic acid. Eating and drinking  Eat a healthy diet that includes fresh fruits and vegetables, whole grains, good sources of protein such as meat, eggs, or tofu, and low-fat dairy products.  Avoid raw meat and unpasteurized juice, milk, and cheese. These carry germs that can harm you and your baby.  If you feel nauseous or you vomit: ? Eat 4 or 5 small meals a day instead of 3 large meals. ? Try eating a few soda crackers. ? Drink liquids between meals instead of during meals.  You may need to take these actions to prevent or treat constipation: ? Drink enough fluid to keep your urine pale yellow. ? Eat foods that are high in fiber, such as beans, whole grains, and fresh fruits and vegetables. ? Limit foods that are high in fat and processed sugars, such as fried or sweet foods. Activity  Exercise only as directed by your health care provider. Most people can continue their usual exercise routine during pregnancy. Try to exercise for 30 minutes at least 5 days a week.  Stop exercising if you develop pain or cramping in the lower abdomen or lower back.  Avoid exercising if it is very hot or humid or if you are at high altitude.  Avoid heavy lifting.  If you choose to, you may have sex unless your health care provider tells you not to. Relieving pain and discomfort  Wear a good support bra to relieve breast   tenderness.  Rest with your legs elevated if you have leg cramps or low back pain.  If you develop bulging veins (varicose veins) in your legs: ? Wear support hose as told by your health care provider. ? Elevate your feet for 15 minutes, 3-4 times a day. ? Limit salt in your diet. Safety  Wear your seat belt at all times when driving or riding in a car.  Talk with your health care provider if someone is verbally or physically abusive to you.  Talk with your health care provider if you are feeling sad or have  thoughts of hurting yourself. Lifestyle  Do not use hot tubs, steam rooms, or saunas.  Do not douche. Do not use tampons or scented sanitary pads.  Do not use herbal remedies, alcohol, illegal drugs, or medicines that are not approved by your health care provider. Chemicals in these products can harm your baby.  Do not use any products that contain nicotine or tobacco, such as cigarettes, e-cigarettes, and chewing tobacco. If you need help quitting, ask your health care provider.  Avoid cat litter boxes and soil used by cats. These carry germs that can cause birth defects in the baby and possibly loss of the unborn baby (fetus) by miscarriage or stillbirth. General instructions  During routine prenatal visits in the first trimester, your health care provider will do a physical exam, perform necessary tests, and ask you how things are going. Keep all follow-up visits. This is important.  Ask for help if you have counseling or nutritional needs during pregnancy. Your health care provider can offer advice or refer you to specialists for help with various needs.  Schedule a dentist appointment. At home, brush your teeth with a soft toothbrush. Floss gently.  Write down your questions. Take them to your prenatal visits. Where to find more information  American Pregnancy Association: americanpregnancy.org  American College of Obstetricians and Gynecologists: acog.org/en/Womens%20Health/Pregnancy  Office on Women's Health: womenshealth.gov/pregnancy Contact a health care provider if you have:  Dizziness.  A fever.  Mild pelvic cramps, pelvic pressure, or nagging pain in the abdominal area.  Nausea, vomiting, or diarrhea that lasts for 24 hours or longer.  A bad-smelling vaginal discharge.  Pain when you urinate.  Known exposure to a contagious illness, such as chickenpox, measles, Zika virus, HIV, or hepatitis. Get help right away if you have:  Spotting or bleeding from your  vagina.  Severe abdominal cramping or pain.  Shortness of breath or chest pain.  Any kind of trauma, such as from a fall or a car crash.  New or increased pain, swelling, or redness in an arm or leg. Summary  The first trimester of pregnancy starts on the first day of your last menstrual period until the end of week 12 (months 1 through 3).  Eating 4 or 5 small meals a day rather than 3 large meals may help to relieve nausea and vomiting.  Do not use any products that contain nicotine or tobacco, such as cigarettes, e-cigarettes, and chewing tobacco. If you need help quitting, ask your health care provider.  Keep all follow-up visits. This is important. This information is not intended to replace advice given to you by your health care provider. Make sure you discuss any questions you have with your health care provider. Document Revised: 04/30/2020 Document Reviewed: 03/06/2020 Elsevier Patient Education  2021 [employer]vier Inc.  

## 2021-01-07 ENCOUNTER — Other Ambulatory Visit: Payer: Medicaid Other

## 2021-01-07 ENCOUNTER — Encounter: Payer: Medicaid Other | Admitting: Advanced Practice Midwife

## 2021-01-09 ENCOUNTER — Encounter: Payer: Self-pay | Admitting: Obstetrics and Gynecology

## 2021-01-09 ENCOUNTER — Ambulatory Visit (INDEPENDENT_AMBULATORY_CARE_PROVIDER_SITE_OTHER): Payer: Medicaid Other | Admitting: Obstetrics and Gynecology

## 2021-01-09 ENCOUNTER — Other Ambulatory Visit: Payer: Medicaid Other

## 2021-01-09 ENCOUNTER — Other Ambulatory Visit: Payer: Self-pay

## 2021-01-09 ENCOUNTER — Other Ambulatory Visit: Payer: Self-pay | Admitting: Obstetrics and Gynecology

## 2021-01-09 VITALS — BP 100/78 | Ht 67.0 in | Wt 147.8 lb

## 2021-01-09 DIAGNOSIS — O209 Hemorrhage in early pregnancy, unspecified: Secondary | ICD-10-CM

## 2021-01-09 DIAGNOSIS — Z8759 Personal history of other complications of pregnancy, childbirth and the puerperium: Secondary | ICD-10-CM

## 2021-01-09 DIAGNOSIS — Z3A01 Less than 8 weeks gestation of pregnancy: Secondary | ICD-10-CM

## 2021-01-09 MED ORDER — PROGESTERONE 200 MG PO CAPS
200.0000 mg | ORAL_CAPSULE | Freq: Every day | ORAL | 0 refills | Status: DC
Start: 2021-01-09 — End: 2021-02-05

## 2021-01-09 NOTE — Progress Notes (Signed)
Patient ID: Rachel Montes, female   DOB: Jul 27, 1986, 35 y.o.   MRN: [id number]  Reason for Consult: Gynecologic Exam (Pt states she started spotting and having right arm pain this started last night.)   Referred by Terald Sleeper, PA-C  Subjective:     HPI:  Rachel Montes is a 35 y.o. female. She presents today with complains of brown spotting and severe cramping last night which woke her up in the middle of the night. She reports her right arm has a constant throbbing pain which started last night.    Past Medical History:  Diagnosis Date  . BRCA negative    [employer]ae panel neg  . Family history of breast cancer   . Family history of ovarian cancer   . Family history of throat cancer   . GBS bacteriuria 10/01/2017  . History of abnormal cervical Papanicolaou smear   . Increased risk of breast cancer    IBIS==33% per [employer]  . 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 Postpartum care following vaginal delivery 04/12/2018  . Postpartum hemorrhage 04/2018  . Shoulder dystocia during labor and delivery 04/2018  . SPROM (prolonged spontaneous rupture of membranes) 04/10/2018   Family History  Problem Relation Age of Onset  . Breast cancer Mother 60  . Ovarian cancer Paternal Grandmother 29  . Cervical cancer Paternal Aunt 35       has contact  . Ovarian cancer Paternal Aunt   . Skin cancer Father   . Cervical cancer Maternal Grandmother        dx 41s  . Throat cancer Maternal Grandfather   . Throat cancer Paternal Grandfather    Past Surgical History:  Procedure Laterality Date  . COLPOSCOPY  12/2017  . LEEP      Short Social History:  Social History   Tobacco Use  . Smoking status: Never Smoker  . Smokeless tobacco: Never Used  Substance Use Topics  . Alcohol use: Yes    Comment: rarely    Allergies  Allergen Reactions  . Symbicort [Budesonide-Formoterol Fumarate]     Current Outpatient Medications  Medication Sig Dispense Refill  . ELDERBERRY PO  Take by mouth daily.    . Prenatal Vit-Fe Fumarate-FA (PRENATAL VITAMINS PO) Take by mouth.    . Probiotic Product (PROBIOTIC PO) Take by mouth daily.    . Turmeric (QC TUMERIC COMPLEX PO) Take by mouth daily.    . Cholecalciferol 75 MCG (3000 UT) TABS  (Patient not taking: Reported on 01/02/2021)     No current facility-administered medications for this visit.    Review of Systems  Constitutional: Negative for chills, fatigue, fever and unexpected weight change.  HENT: Negative for trouble swallowing.  Eyes: Negative for loss of vision.  Respiratory: Negative for cough, shortness of breath and wheezing.  Cardiovascular: Negative for chest pain, leg swelling, palpitations and syncope.  GI: Negative for abdominal pain, blood in stool, diarrhea, nausea and vomiting.  GU: Negative for difficulty urinating, dysuria, frequency and hematuria.  Musculoskeletal: Negative for back pain, leg pain and joint pain.  Skin: Negative for rash.  Neurological: Negative for dizziness, headaches, light-headedness, numbness and seizures.  Psychiatric: Negative for behavioral problem, confusion, depressed mood and sleep disturbance.        Objective:  Objective   Vitals:   01/09/21 1125  BP: 100/78  Weight: 147 lb 12.8 oz (67 kg)  Height: $Remove'5\' 7"'TkvbSkx$  (1.702 m)   Body mass index is 23.15 kg/m.  Physical Exam Vitals and nursing note reviewed.  Constitutional:      Appearance: She is well-developed and well-nourished.  HENT:     Head: Normocephalic and atraumatic.  Eyes:     Extraocular Movements: EOM normal.     Pupils: Pupils are equal, round, and reactive to light.  Cardiovascular:     Rate and Rhythm: Normal rate and regular rhythm.  Pulmonary:     Effort: Pulmonary effort is normal. No respiratory distress.  Skin:    General: Skin is warm and dry.  Neurological:     Mental Status: She is alert and oriented to person, place, and time.  Psychiatric:        Mood and Affect: Mood and affect  normal.        Behavior: Behavior normal.        Thought Content: Thought content normal.        Judgment: Judgment normal.     Assessment/Plan:    35 yo with bleeding in early stage of pregnancy TVUS today shows IUP with + FHR.  Patient feels reassured regarding fetal status. Continue to monitor. Follow up next week.  Discussed option for initiation of vaginal progesterone until [redacted] weeks gestation given her history of prior pregnancy loss. Patient would like to try this. Prescription sent.   At present there is no evidence to support the use of vaginal progesterone in women with a history of unexplained recurrent first trimester miscarriage in an attempt to minimize risk of miscarriage in a subsequent pregnancy.  Its use did not show a clinically significant increase in live birth rate.  However, there were also no identified adverse events to its use.  This data was shared with the patient.    "A Randomized Trial of Progesterone in Women with Recurrent Miscarriages" NEJM 373;22 October 31, 2014  More than 20 minutes were spent face to face with the patient in the room, reviewing the medical record, labs and images, and coordinating care for the patient. The plan of management was discussed in detail and counseling was provided.    Adrian Prows MD [facility], [facility] 01/09/2021 12:04 PM

## 2021-01-09 NOTE — Progress Notes (Signed)
Pt states she's spotting and has right arm pain.

## 2021-01-11 ENCOUNTER — Emergency Department: Payer: Medicaid Other

## 2021-01-11 ENCOUNTER — Emergency Department
Admission: EM | Admit: 2021-01-11 | Discharge: 2021-01-11 | Disposition: A | Discharge status: Home / Self Care | Payer: Medicaid Other | Attending: Emergency Medicine | Admitting: Emergency Medicine

## 2021-01-11 ENCOUNTER — Encounter: Payer: Self-pay | Admitting: Emergency Medicine

## 2021-01-11 ENCOUNTER — Other Ambulatory Visit: Payer: Self-pay

## 2021-01-11 DIAGNOSIS — O200 Threatened abortion: Secondary | ICD-10-CM | POA: Diagnosis not present

## 2021-01-11 DIAGNOSIS — Z23 Encounter for immunization: Secondary | ICD-10-CM | POA: Insufficient documentation

## 2021-01-11 DIAGNOSIS — Z3A01 Less than 8 weeks gestation of pregnancy: Secondary | ICD-10-CM | POA: Diagnosis not present

## 2021-01-11 DIAGNOSIS — J45909 Unspecified asthma, uncomplicated: Secondary | ICD-10-CM | POA: Diagnosis not present

## 2021-01-11 DIAGNOSIS — O209 Hemorrhage in early pregnancy, unspecified: Secondary | ICD-10-CM | POA: Diagnosis not present

## 2021-01-11 DIAGNOSIS — Z8616 Personal history of COVID-19: Secondary | ICD-10-CM | POA: Insufficient documentation

## 2021-01-11 DIAGNOSIS — O99511 Diseases of the respiratory system complicating pregnancy, first trimester: Secondary | ICD-10-CM | POA: Insufficient documentation

## 2021-01-11 DIAGNOSIS — O4691 Antepartum hemorrhage, unspecified, first trimester: Secondary | ICD-10-CM | POA: Diagnosis present

## 2021-01-11 LAB — BASIC METABOLIC PANEL
Anion gap: 10 (ref 5–15)
BUN: 7 mg/dL (ref 6–20)
CO2: 24 mmol/L (ref 22–32)
Calcium: 9.6 mg/dL (ref 8.9–10.3)
Chloride: 103 mmol/L (ref 98–111)
Creatinine, Ser: 0.5 mg/dL (ref 0.44–1.00)
GFR, Estimated: >60 mL/min (ref >60)
Glucose, Bld: 100 mg/dL — ABNORMAL HIGH (ref 70–99)
Potassium: 3.9 mmol/L (ref 3.5–5.1)
Sodium: 137 mmol/L (ref 135–145)

## 2021-01-11 LAB — CBC WITH DIFFERENTIAL/PLATELET
Abs Immature Granulocytes: 0.01 10*3/uL (ref 0.00–0.07)
Basophils Absolute: 0.1 10*3/uL (ref 0.0–0.1)
Basophils Relative: 1 %
Eosinophils Absolute: 0.1 10*3/uL (ref 0.0–0.5)
Eosinophils Relative: 1 %
HCT: 38.8 % (ref 36.0–46.0)
Hemoglobin: 13.2 g/dL (ref 12.0–15.0)
Immature Granulocytes: 0 %
Lymphocytes Relative: 21 %
Lymphs Abs: 1.7 10*3/uL (ref 0.7–4.0)
MCH: 29.5 pg (ref 26.0–34.0)
MCHC: 34 g/dL (ref 30.0–36.0)
MCV: 86.8 fL (ref 80.0–100.0)
Monocytes Absolute: 0.7 10*3/uL (ref 0.1–1.0)
Monocytes Relative: 8 %
Neutro Abs: 5.6 10*3/uL (ref 1.7–7.7)
Neutrophils Relative %: 69 %
Platelets: 294 10*3/uL (ref 150–400)
RBC: 4.47 MIL/uL (ref 3.87–5.11)
RDW: 13.1 % (ref 11.5–15.5)
WBC: 8.1 10*3/uL (ref 4.0–10.5)
nRBC: 0 % (ref 0.0–0.2)

## 2021-01-11 LAB — ANTIBODY SCREEN: Antibody Screen: NEGATIVE

## 2021-01-11 LAB — ABO/RH: ABO/RH(D): O NEG

## 2021-01-11 LAB — HCG, QUANTITATIVE, PREGNANCY: hCG, Beta Chain, Quant, S: 6600 m[IU]/mL — ABNORMAL HIGH (ref <5)

## 2021-01-11 MED ORDER — RHO D IMMUNE GLOBULIN 1500 UNIT/2ML IJ SOSY
300.0000 ug | PREFILLED_SYRINGE | Freq: Once | INTRAMUSCULAR | Status: AC
  Administered 2021-01-11: 300 ug via INTRAMUSCULAR
  Filled 2021-01-11: qty 2

## 2021-01-11 NOTE — ED Provider Notes (Signed)
[facility] Emergency Department Provider Note  ____________________________________________   I have reviewed the triage vital signs and the nursing notes.   HISTORY  Chief Complaint Vaginal Bleeding   History limited by: Not Limited   HPI Rachel Montes is a 35 y.o. female who is roughly [redacted] weeks pregnant who presents to the emergency department today because of concern for vaginal bleeding. Patient states that she had some darker bleeding a few days ago. However it is now bright red. The patient states she has had some accompanying lower abdominal cramping. The patient was seen for this at ob gyn office 2 days ago with ultrasound showing IUP. The patient has also been seen recently for shortness of breath and chest pain, but states that this seems to be improving. Denies any fevers.   Records reviewed. Per medical record review patient has a history of ob/gyn visit two days ago. Per their documentation US showed IUP.   Past Medical History:  Diagnosis Date  . BRCA negative    Invitae panel neg  . Family history of breast cancer   . Family history of ovarian cancer   . Family history of throat cancer   . GBS bacteriuria 10/01/2017  . History of abnormal cervical Papanicolaou smear   . Increased risk of breast cancer    IBIS==33% per [employer]  . 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 Postpartum care following vaginal delivery 04/12/2018  . Postpartum hemorrhage 04/2018  . Shoulder dystocia during labor and delivery 04/2018  . SPROM (prolonged spontaneous rupture of membranes) 04/10/2018    Patient Active Problem List   Diagnosis Date Noted  . Supervision of high risk pregnancy in first trimester 12/24/2020  . Multigravida of advanced maternal age in first trimester 12/24/2020  . Reactive airway disease 11/18/2020  . Shortness of breath 11/18/2020  . At high risk for breast cancer 08/19/2020  . History of COVID-19 04/10/2020  . Genetic testing  01/24/2020  . Family history of throat cancer   . Family history of ovarian cancer 07/06/2019  . Family history of breast cancer in mother 07/06/2019  . Rh negative state in antepartum period 12/27/2017    Past Surgical History:  Procedure Laterality Date  . COLPOSCOPY  12/2017  . LEEP      Prior to Admission medications   Medication Sig Start Date End Date Taking? Authorizing Provider  Cholecalciferol 75 MCG (3000 UT) TABS  11/18/20   [provider]  ELDERBERRY PO Take by mouth daily.    [provider]  Prenatal Vit-Fe Fumarate-FA (PRENATAL VITAMINS PO) Take by mouth.    [provider]  Probiotic Product (PROBIOTIC PO) Take by mouth daily.    [provider]  progesterone (PROMETRIUM) 200 MG capsule Take 1 capsule (200 mg total) by mouth at bedtime. Insert 1 capsule vaginally at bedtime 01/09/21 02/20/21  Schuman, Stefanie Libel, MD  Turmeric (QC TUMERIC COMPLEX PO) Take by mouth daily.    [provider]    Allergies Symbicort [budesonide-formoterol fumarate]  Family History  Problem Relation Age of Onset  . Breast cancer Mother 51  . Ovarian cancer Paternal Grandmother 45  . Cervical cancer Paternal Aunt 40       has contact  . Ovarian cancer Paternal Aunt   . Skin cancer Father   . Cervical cancer Maternal Grandmother        dx 42s  . Throat cancer Maternal Grandfather   . Throat cancer Paternal Grandfather  Social History Social History   Tobacco Use  . Smoking status: Never Smoker  . Smokeless tobacco: Never Used  Vaping Use  . Vaping Use: Never used  Substance Use Topics  . Alcohol use: Yes    Comment: rarely  . Drug use: No    Review of Systems Constitutional: No fever/chills Eyes: No visual changes. ENT: No sore throat. Cardiovascular: Denies chest pain. Respiratory: Denies shortness of breath. Gastrointestinal: No abdominal pain.  No nausea, no vomiting.  No diarrhea.   Genitourinary: Positive for  vaginal bleeding.  Musculoskeletal: Negative for back pain. Skin: Negative for rash. Neurological: Negative for headaches, focal weakness or numbness.  ____________________________________________   PHYSICAL EXAM:  VITAL SIGNS: ED Triage Vitals  Enc Vitals Group     BP 01/11/21 1428 (!) 98/98     Pulse Rate 01/11/21 1426 92     Resp 01/11/21 1426 16     Temp 01/11/21 1426 98.1 F (36.7 C)     Temp Source 01/11/21 1426 Oral     SpO2 01/11/21 1426 100 %     Weight 01/11/21 1424 147 lb (66.7 kg)     Height 01/11/21 1424 $RemoveBefor'5\' 7"'UyqeViKLxror$  (1.702 m)     Head Circumference --      Peak Flow --      Pain Score 01/11/21 1424 7   Constitutional: Alert and oriented.  Eyes: Conjunctivae are normal.  ENT      Head: Normocephalic and atraumatic.      Nose: No congestion/rhinnorhea.      Mouth/Throat: Mucous membranes are moist.      Neck: No stridor. Hematological/Lymphatic/Immunilogical: No cervical lymphadenopathy. Cardiovascular: Normal rate, regular rhythm.  No murmurs, rubs, or gallops.  Respiratory: Normal respiratory effort without tachypnea nor retractions. Breath sounds are clear and equal bilaterally. No wheezes/rales/rhonchi. Gastrointestinal: Soft and non tender. No rebound. No guarding.  Genitourinary: Deferred Musculoskeletal: Normal range of motion in all extremities. No lower extremity edema. Neurologic:  Normal speech and language. No gross focal neurologic deficits are appreciated.  Skin:  Skin is warm, dry and intact. No rash noted. Psychiatric: Mood and affect are normal. Speech and behavior are normal. Patient exhibits appropriate insight and judgment.  ____________________________________________    LABS (pertinent positives/negatives)  CBC wbc 8.1, hgb 13.2, plt 294 BMP wnl except glu 100 O Neg bHCG 6600 ____________________________________________   EKG  None  ____________________________________________    RADIOLOGY  Transvaginal US Gestational sac,  yolk sac and fetal pole identified. No cardiac activity. Subchorionic hematoma.   ____________________________________________   PROCEDURES  Procedures  ____________________________________________   INITIAL IMPRESSION / ASSESSMENT AND PLAN / ED COURSE  Pertinent labs & imaging results that were available during my care of the patient were reviewed by me and considered in my medical decision making (see chart for details).   Patient presented to the emergency department today because of concerns for vaginal bleeding in the setting of early pregnancy.  Patient's ultrasound unfortunately did not see any cardiac activity today.  I discussed this with the patient.  Did discuss concern for possible miscarriage.  Discussed importance of close OB/GYN follow-up.  Patient was given RhoGam given negative status. ____________________________________________   FINAL CLINICAL IMPRESSION(S) / ED DIAGNOSES  Final diagnoses:  Threatened miscarriage     Note: This dictation was prepared with Dragon dictation. Any transcriptional errors that result from this process are unintentional     Nance Pear, MD 01/11/21 1723

## 2021-01-11 NOTE — Discharge Instructions (Addendum)
Please seek medical attention for any high fevers, chest pain, shortness of breath, change in behavior, persistent vomiting, bloody stool or any other new or concerning symptoms.  

## 2021-01-11 NOTE — ED Triage Notes (Signed)
Pt to ED via POV, pt states that she had positive pregnancy test on 1/18, pt reports that she had some bleeding around week 5, week 6 no bleeding. Pt states that Monday she started having rusty color bleeding and on Wednesday it became bright red blood. Pt states that she has had mild cramping in her lower abdomen. Pt reports that she has had some mild pain in her LLQ and radiated into her back and down her leg for 2 weeks. Pt is in NAD. Pt states that she is Rh negative

## 2021-01-11 NOTE — ED Notes (Signed)
This tech attempted to obtain blood specimen, was unsuccessful.

## 2021-01-12 LAB — RHOGAM INJECTION: Unit division: 0

## 2021-01-13 ENCOUNTER — Telehealth: Payer: Self-pay

## 2021-01-13 ENCOUNTER — Other Ambulatory Visit: Payer: Self-pay | Admitting: Obstetrics and Gynecology

## 2021-01-13 ENCOUNTER — Other Ambulatory Visit: Payer: Medicaid Other

## 2021-01-13 NOTE — Telephone Encounter (Signed)
Pt calling; woke up at 1am c severe cramping; by 4am she was passing clots; ? Miscarriage; what to do?  [phone]

## 2021-01-13 NOTE — Progress Notes (Unsigned)
Discussed bleeding precautions in detail with the patient. Discussed work in this afternoon versus keeping planned appointment time tomorrow.

## 2021-01-14 ENCOUNTER — Other Ambulatory Visit: Payer: Self-pay

## 2021-01-14 ENCOUNTER — Ambulatory Visit (INDEPENDENT_AMBULATORY_CARE_PROVIDER_SITE_OTHER): Payer: Medicaid Other

## 2021-01-14 ENCOUNTER — Ambulatory Visit (INDEPENDENT_AMBULATORY_CARE_PROVIDER_SITE_OTHER): Payer: Medicaid Other | Admitting: Obstetrics

## 2021-01-14 ENCOUNTER — Other Ambulatory Visit: Payer: Self-pay | Admitting: Obstetrics & Gynecology

## 2021-01-14 ENCOUNTER — Encounter: Payer: Self-pay | Admitting: Obstetrics

## 2021-01-14 VITALS — BP 100/70 | Wt 150.0 lb

## 2021-01-14 DIAGNOSIS — O0991 Supervision of high risk pregnancy, unspecified, first trimester: Secondary | ICD-10-CM

## 2021-01-14 DIAGNOSIS — O209 Hemorrhage in early pregnancy, unspecified: Secondary | ICD-10-CM | POA: Diagnosis not present

## 2021-01-14 DIAGNOSIS — O034 Incomplete spontaneous abortion without complication: Secondary | ICD-10-CM

## 2021-01-14 DIAGNOSIS — Z0189 Encounter for other specified special examinations: Secondary | ICD-10-CM

## 2021-01-14 DIAGNOSIS — O039 Complete or unspecified spontaneous abortion without complication: Secondary | ICD-10-CM | POA: Diagnosis not present

## 2021-01-14 DIAGNOSIS — R1084 Generalized abdominal pain: Secondary | ICD-10-CM

## 2021-01-14 DIAGNOSIS — Z3A01 Less than 8 weeks gestation of pregnancy: Secondary | ICD-10-CM | POA: Diagnosis not present

## 2021-01-14 MED ORDER — MISOPROSTOL 200 MCG PO TABS: ORAL_TABLET | ORAL | 0 refills | Status: DC

## 2021-01-14 MED ORDER — OXYCODONE HCL 5 MG PO TABS: 5.0000 mg | ORAL_TABLET | ORAL | 0 refills | Status: DC | PRN

## 2021-01-14 NOTE — Progress Notes (Signed)
Obstetrics & Gynecology Office Visit   Chief Complaint:  Chief Complaint  Patient presents with  . Routine Prenatal Visit    History of Present Illness: Rachel Montes returns to the office for another ultrasound and to discuss POC for what is now accepted as a miscarriage in progress. She has been seen for bleeding in early pregnancy, and with several ultrasounds performed both at [facility] and at the ED now confirming  No cardiac activity in an early pregnancy, She knows that her bleeding has been an SAB process. She verbalizes a desire to " get things finished".  Rachel Montes was using OCPs when she got pregnant. She shares that she is not planning on having more children. She continues having light bleeding, with some cramping.   Review of Systems:  ROS negative  Past Medical History:  Past Medical History:  Diagnosis Date  . BRCA negative    Invitae panel neg  . Family history of breast cancer   . Family history of ovarian cancer   . Family history of throat cancer   . GBS bacteriuria 10/01/2017  . History of abnormal cervical Papanicolaou smear   . Increased risk of breast cancer    IBIS==33% per [employer]ae  . 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 Postpartum care following vaginal delivery 04/12/2018  . Postpartum hemorrhage 04/2018  . Shoulder dystocia during labor and delivery 04/2018  . SPROM (prolonged spontaneous rupture of membranes) 04/10/2018  . Supervision of high risk pregnancy in first trimester 12/24/2020   [facility] Prenatal Labs Dating  Blood type:    Genetic Screen 1 Screen:    AFP:     Quad:     NIPS: Antibody:  Anatomic [place]  Rubella:    Varicella: $RemoveBef'@VZVIGG'gZgokUHlnf$ @ GTT Early: NA               Third trimester:  RPR:    Rhogam  HBsAg:    Vaccines TDAP:                       Flu Shot: Covid: HIV:    Baby Food Breast                               GBS:   GC/CT: Contraception  Pap: 2021 negative, hx LEEP    Past Surgical History:  Past Surgical History:  Procedure Laterality Date   . COLPOSCOPY  12/2017  . LEEP      Gynecologic History: Patient's last menstrual period was 11/16/2020 (exact date).  Obstetric History: [id number]  Family History:  Family History  Problem Relation Age of Onset  . Breast cancer Mother 23  . Ovarian cancer Paternal Grandmother 54  . Cervical cancer Paternal Aunt 18       has contact  . Ovarian cancer Paternal Aunt   . Skin cancer Father   . Cervical cancer Maternal Grandmother        dx 18s  . Throat cancer Maternal Grandfather   . Throat cancer Paternal Grandfather     Social History:  Social History   Socioeconomic History  . Marital status: Married    Spouse name: Not on file  . Number of children: 2  . Years of education: Not on file  . Highest education level: Not on file  Occupational History  . Not on file  Tobacco Use  . Smoking status: Never Smoker  . Smokeless tobacco:  Never Used  Vaping Use  . Vaping Use: Never used  Substance and Sexual Activity  . Alcohol use: Yes    Comment: rarely  . Drug use: No  . Sexual activity: Yes    Birth control/protection: Pill  Other Topics Concern  . Not on file  Social History Narrative  . Not on file   Social Determinants of Health   Financial Resource Strain: Not on file  Food Insecurity: Not on file  Transportation Needs: Not on file  Physical Activity: Not on file  Stress: Not on file  Social Connections: Not on file  Intimate Partner Violence: Not on file    Allergies:  Allergies  Allergen Reactions  . Symbicort [Budesonide-Formoterol Fumarate]     Medications: Prior to Admission medications   Medication Sig Start Date End Date Taking? Authorizing Provider  misoprostol (CYTOTEC) 200 MCG tablet insert four tablets vaginally, repeat every in  6-hrs as needed. 01/14/21  Yes Imagene Riches, CNM  Prenatal Vit-Fe Fumarate-FA (PRENATAL VITAMINS PO) Take by mouth.   Yes [provider]  Cholecalciferol 75 MCG (3000 UT) TABS  11/18/20   [provider]  ELDERBERRY PO Take by mouth daily. Patient not taking: Reported on 01/14/2021    [provider]  oxyCODONE (ROXICODONE) 5 MG immediate release tablet Take 1 tablet (5 mg total) by mouth every 4 (four) hours as needed for severe pain. 01/14/21   Imagene Riches, CNM  Probiotic Product (PROBIOTIC PO) Take by mouth daily. Patient not taking: Reported on 01/14/2021    [provider]  progesterone (PROMETRIUM) 200 MG capsule Take 1 capsule (200 mg total) by mouth at bedtime. Insert 1 capsule vaginally at bedtime Patient not taking: Reported on 01/14/2021 01/09/21 02/20/21  Homero Fellers, MD  Turmeric (QC TUMERIC COMPLEX PO) Take by mouth daily. Patient not taking: Reported on 01/14/2021    [provider]    Physical Exam Vitals:  Vitals:   01/14/21 0928  BP: 100/70   Patient's last menstrual period was 11/16/2020 (exact date).  Physical Exam deferred today See sono report: ultrasound-pregnancy is within the cervical canal,with CRL consistant with 5 w6d. FHR is absent. The CRL is 2.45mm  And GS is 10.1 mm  Assessment: 35 y.o. [id number]  With miscarriage in progress   Plan: Problem List Items Addressed This Visit      Other   RESOLVED: Supervision of high risk pregnancy in first trimester    Other Visit Diagnoses    Bleeding in early pregnancy    -  Primary   Incomplete miscarriage       Relevant Medications   oxyCODONE (ROXICODONE) 5 MG immediate release tablet   Generalized abdominal cramping       Relevant Medications   oxyCODONE (ROXICODONE) 5 MG immediate release tablet   SAB (spontaneous abortion)       Relevant Medications   oxyCODONE (ROXICODONE) 5 MG immediate release tablet     We discussed how to best help her complete her SAB. Discussed POC with Dr. Glennon Mac. Will provide her with Cytotec 800 mcg to place vaginally this evening. In addition some limited Roxycodone prescribed for severe cramping. Follow up planned for a week   from now. Also discussed moving from OCPs to an IUD, and she is interested. Will likely discuss the IUD appt next week after her ultrasound and SAB follow up.She has been instructed to place the medication this evening, and bleeding precautions provided. RTC in one week with  Dr. Gilman Schmidt or another MD. Plan for IUD placement in several weeks should she desire. A total of 30 minutes spent reviewing her records, and ultrasound report, discussing the findings, documentation and outlining the plan of care for follow up with the patient.  Imagene Riches, CNM  01/14/2021 12:53 PM

## 2021-01-15 ENCOUNTER — Telehealth: Payer: Self-pay

## 2021-01-15 NOTE — Telephone Encounter (Signed)
Pt calling; last night used medication to pass miscarriage; nothing happened - no cramping, slept all night; did it work?  Has four more pills left.  [phone]  Reiterated instructions to pt to repeat in 6hrs; pt will repeat tonight when another adult will be around to help with children.

## 2021-01-16 ENCOUNTER — Other Ambulatory Visit: Payer: Self-pay | Admitting: Obstetrics and Gynecology

## 2021-01-16 ENCOUNTER — Telehealth: Payer: Self-pay

## 2021-01-16 DIAGNOSIS — O034 Incomplete spontaneous abortion without complication: Secondary | ICD-10-CM

## 2021-01-16 MED ORDER — MISOPROSTOL 200 MCG PO TABS: ORAL_TABLET | ORAL | 0 refills | Status: DC

## 2021-01-16 NOTE — Telephone Encounter (Signed)
Returned call. Patient reported passing dime and quarter size clots. She has been having vaginal bleeding and saturating pads. I discussed coming to the office today or to the ER, but she has her 37 yo with her and is resistant to coming in to be seen.  Seems like she has been only on 200 mcg of vaginal cytotec. Resent prescription for a higher dose of buccal cytotec.  Discussed bleeding precautions of feeling, fiant or dizzy, passing palm size clots or saturating more than a pad every hour as reasons to seek emergency care.  Patient is now more open to a D&E, will try to schedule for next Tuesday since she does not seem to want to come in today.

## 2021-01-16 NOTE — Telephone Encounter (Signed)
Pt calling; took remaining four pills to push out miscarriage last night; again no cramping or pain but has had blood clots both mornings; is medication working? Doesn't want infection; has u/s on the 22nd.  [phone]

## 2021-01-16 NOTE — Telephone Encounter (Signed)
-----   Message from Nadara Mustard, MD sent at 01/16/2021  2:08 PM EST ----- Regarding: surg Surgery Booking Request Patient Full Name:  Rachel Montes  MRN: [id number]  DOB: 07-13-1986  Surgeon: Letitia Libra, MD  Requested Surgery Date and Time: 2/15 Primary Diagnosis AND Code: Incomplete abortion Secondary Diagnosis and Code:  Surgical Procedure: Suction D&C RNFA Requested?: No L&D Notification: No Admission Status: same day surgery Length of Surgery: 25 min Special Case Needs: No H&P: Yes Phone Interview???:  Yes Interpreter: No Medical Clearance:  No Special Scheduling Instructions: No Any known health/anesthesia issues, diabetes, sleep apnea, latex allergy, defibrillator/pacemaker?: No Acuity: P1   (P1 highest, P2 delay may cause harm, P3 low, elective gyn, P4 lowest)

## 2021-01-16 NOTE — Telephone Encounter (Signed)
No, cancel appt w MF.  I would eant to see her 2 weeks after procedure, and not for IUD just yet

## 2021-01-16 NOTE — Telephone Encounter (Signed)
Called patient to schedule suction D&C w Tiburcio Pea  DOS 2/15  H&P 2/14 @ 1:20   Covid testing 2/14 @ 8-10:30, [address], drive up and wear mask. Advised pt to quarantine until DOS.  Pre-admit phone call appointment to be requested - date and time will be included on H&P paper work. Also all appointments will be updated on pt MyChart. Explained that this appointment has a call window. Based on the time scheduled will indicate if the call will be received within a 4 hour window before 1:00 or after.  Advised that pt may also receive calls from the hospital pharmacy and pre-service center.  Confirmed pt has [employer] as primary insurance. No secondary insurance.

## 2021-01-19 ENCOUNTER — Ambulatory Visit (INDEPENDENT_AMBULATORY_CARE_PROVIDER_SITE_OTHER): Payer: Medicaid Other | Admitting: Obstetrics & Gynecology

## 2021-01-19 ENCOUNTER — Encounter: Payer: Self-pay | Admitting: Obstetrics & Gynecology

## 2021-01-19 ENCOUNTER — Other Ambulatory Visit
Admission: RE | Admit: 2021-01-19 | Discharge: 2021-01-19 | Disposition: A | Discharge status: Home / Self Care | Payer: Medicaid Other | Source: Ambulatory Visit | Attending: Obstetrics & Gynecology | Admitting: Obstetrics & Gynecology

## 2021-01-19 ENCOUNTER — Other Ambulatory Visit: Payer: Self-pay

## 2021-01-19 VITALS — BP 100/60 | Ht 67.0 in | Wt 149.0 lb

## 2021-01-19 DIAGNOSIS — Z20822 Contact with and (suspected) exposure to covid-19: Secondary | ICD-10-CM | POA: Insufficient documentation

## 2021-01-19 DIAGNOSIS — Z01812 Encounter for preprocedural laboratory examination: Secondary | ICD-10-CM | POA: Diagnosis not present

## 2021-01-19 DIAGNOSIS — O034 Incomplete spontaneous abortion without complication: Secondary | ICD-10-CM

## 2021-01-19 LAB — SARS CORONAVIRUS 2 (TAT 6-24 HRS): SARS Coronavirus 2: NEGATIVE

## 2021-01-19 MED ORDER — LACTATED RINGERS IV SOLN: INTRAVENOUS | Status: DC

## 2021-01-19 NOTE — H&P (View-Only) (Signed)
PRE-OPERATIVE HISTORY AND PHYSICAL EXAM  HPI:  Rachel Montes is a 35 y.o. [id number] Patient's last menstrual period was 11/16/2020 (exact date).; she is being admitted for surgery related to incomplete abortion as exemplified by continued bleeding and presecen of ultrasound intrauterine gestation without heart beat.  SHe has had several [place] to monitor for this diagnosis.  PMHx: Past Medical History:  Diagnosis Date  . BRCA negative    Invitae panel neg  . Family history of breast cancer   . Family history of ovarian cancer   . Family history of throat cancer   . GBS bacteriuria 10/01/2017  . History of abnormal cervical Papanicolaou smear   . Increased risk of breast cancer    IBIS==33% per [employer]  . 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 Postpartum care following vaginal delivery 04/12/2018  . Postpartum hemorrhage 04/2018  . Shoulder dystocia during labor and delivery 04/2018  . SPROM (prolonged spontaneous rupture of membranes) 04/10/2018  . Supervision of high risk pregnancy in first trimester 12/24/2020   [facility] Prenatal Labs Dating  Blood type:    Genetic Screen 1 Screen:    AFP:     Quad:     NIPS: Antibody:  Anatomic [place]  Rubella:    Varicella: $RemoveBef'@VZVIGG'tzGOXVbMtr$ @ GTT Early: NA               Third trimester:  RPR:    Rhogam  HBsAg:    Vaccines TDAP:                       Flu Shot: Covid: HIV:    Baby Food Breast                               GBS:   GC/CT: Contraception  Pap: 2021 negative, hx LEEP   Past Surgical History:  Procedure Laterality Date  . COLPOSCOPY  12/2017  . LEEP     Family History  Problem Relation Age of Onset  . Breast cancer Mother 19  . Ovarian cancer Paternal Grandmother 11  . Cervical cancer Paternal Aunt 69       has contact  . Ovarian cancer Paternal Aunt   . Skin cancer Father   . Cervical cancer Maternal Grandmother        dx 68s  . Throat cancer Maternal Grandfather   . Throat cancer Paternal Grandfather    Social History   Tobacco Use   . Smoking status: Never Smoker  . Smokeless tobacco: Never Used  Vaping Use  . Vaping Use: Never used  Substance Use Topics  . Alcohol use: Yes    Comment: rarely  . Drug use: No    Current Outpatient Medications:  .  Cholecalciferol 75 MCG (3000 UT) TABS, , Disp: , Rfl:  .  ELDERBERRY PO, Take by mouth daily. (Patient not taking: No sig reported), Disp: , Rfl:  .  misoprostol (CYTOTEC) 200 MCG tablet, Place four tablets in between your gums and cheeks (two tablets on each side) as instructed OR insert four tablets vaginally, repeat every 3-hrs as needed (Patient not taking: Reported on 01/19/2021), Disp: 12 tablet, Rfl: 0 .  oxyCODONE (ROXICODONE) 5 MG immediate release tablet, Take 1 tablet (5 mg total) by mouth every 4 (four) hours as needed for severe pain. (Patient not taking: Reported on 01/19/2021), Disp: 10 tablet, Rfl: 0 .  Prenatal Vit-Fe Fumarate-FA (  PRENATAL VITAMINS PO), Take by mouth. (Patient not taking: Reported on 01/19/2021), Disp: , Rfl:  .  Probiotic Product (PROBIOTIC PO), Take by mouth daily. (Patient not taking: No sig reported), Disp: , Rfl:  .  progesterone (PROMETRIUM) 200 MG capsule, Take 1 capsule (200 mg total) by mouth at bedtime. Insert 1 capsule vaginally at bedtime (Patient not taking: No sig reported), Disp: 42 capsule, Rfl: 0 .  Turmeric (QC TUMERIC COMPLEX PO), Take by mouth daily. (Patient not taking: No sig reported), Disp: , Rfl:  Allergies: Symbicort [budesonide-formoterol fumarate]  Review of Systems  Constitutional: Negative for chills, fever and malaise/fatigue.  HENT: Negative for congestion, sinus pain and sore throat.   Eyes: Negative for blurred vision and pain.  Respiratory: Negative for cough and wheezing.   Cardiovascular: Negative for chest pain and leg swelling.  Gastrointestinal: Positive for abdominal pain. Negative for constipation, diarrhea, heartburn, nausea and vomiting.  Genitourinary: Negative for dysuria, frequency, hematuria and  urgency.  Musculoskeletal: Negative for back pain, joint pain, myalgias and neck pain.  Skin: Negative for itching and rash.  Neurological: Negative for dizziness, tremors and weakness.  Endo/Heme/Allergies: Does not bruise/bleed easily.  Psychiatric/Behavioral: Negative for depression. The patient is not nervous/anxious and does not have insomnia.     Objective: BP 100/60   Ht $R'5\' 7"'uM$  (1.702 m)   Wt 149 lb (67.6 kg)   LMP 11/16/2020 (Exact Date) Comment: neg upt  BMI 23.34 kg/m   Filed Weights   01/19/21 1312  Weight: 149 lb (67.6 kg)   Physical Exam Constitutional:      General: She is not in acute distress.    Appearance: She is well-developed.  HENT:     Head: Normocephalic and atraumatic. No laceration.     Right Ear: Hearing normal.     Left Ear: Hearing normal.     Mouth/Throat:     Pharynx: Uvula midline.  Eyes:     Pupils: Pupils are equal, round, and reactive to light.  Neck:     Thyroid: No thyromegaly.  Cardiovascular:     Rate and Rhythm: Normal rate and regular rhythm.     Heart sounds: No murmur heard. No friction rub. No gallop.   Pulmonary:     Effort: Pulmonary effort is normal. No respiratory distress.     Breath sounds: Normal breath sounds. No wheezing.  Abdominal:     General: Bowel sounds are normal. There is no distension.     Palpations: Abdomen is soft.     Tenderness: There is no abdominal tenderness. There is no rebound.  Musculoskeletal:        General: Normal range of motion.     Cervical back: Normal range of motion and neck supple.  Neurological:     Mental Status: She is alert and oriented to person, place, and time.     Cranial Nerves: No cranial nerve deficit.  Skin:    General: Skin is warm and dry.  Psychiatric:        Judgment: Judgment normal.  Vitals reviewed.     Assessment: 1. Incomplete abortion   Plan D&C as therapeutic intervention    Alternatives and risks discussed  Plans IUD at 2-4 weeks for contraception     Does not desire future fertility  I have had a careful discussion with this patient about all the options available and the risk/benefits of each. I have fully informed this patient that surgery may subject her to a variety of discomforts and risks:  She understands that most patients have surgery with little difficulty, but problems can happen ranging from minor to fatal. These include nausea, vomiting, pain, bleeding, infection, poor healing, hernia, or formation of adhesions. Unexpected reactions may occur from any drug or anesthetic given. Unintended injury may occur to other pelvic or abdominal structures such as Fallopian tubes, ovaries, bladder, ureter (tube from kidney to bladder), or bowel. Nerves going from the pelvis to the legs may be injured. Any such injury may require immediate or later additional surgery to correct the problem. Excessive blood loss requiring transfusion is very unlikely but possible. Dangerous blood clots may form in the legs or lungs. Physical and sexual activity will be restricted in varying degrees for an indeterminate period of time but most often 2-6 weeks.  Finally, she understands that it is impossible to list every possible undesirable effect and that the condition for which surgery is done is not always cured or significantly improved, and in rare cases may be even worse.Ample time was given to answer all questions.  Barnett Applebaum, MD, [facility], [facility] 01/19/2021  1:44 PM

## 2021-01-19 NOTE — Progress Notes (Signed)
PRE-OPERATIVE HISTORY AND PHYSICAL EXAM  HPI:  Rachel Montes is a 35 y.o. [id number] Patient's last menstrual period was 11/16/2020 (exact date).; she is being admitted for surgery related to incomplete abortion as exemplified by continued bleeding and presecen of ultrasound intrauterine gestation without heart beat.  SHe has had several [place] to monitor for this diagnosis.  PMHx: Past Medical History:  Diagnosis Date  . BRCA negative    Invitae panel neg  . Family history of breast cancer   . Family history of ovarian cancer   . Family history of throat cancer   . GBS bacteriuria 10/01/2017  . History of abnormal cervical Papanicolaou smear   . Increased risk of breast cancer    IBIS==33% per [employer]  . 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 Postpartum care following vaginal delivery 04/12/2018  . Postpartum hemorrhage 04/2018  . Shoulder dystocia during labor and delivery 04/2018  . SPROM (prolonged spontaneous rupture of membranes) 04/10/2018  . Supervision of high risk pregnancy in first trimester 12/24/2020   [facility] Prenatal Labs Dating  Blood type:    Genetic Screen 1 Screen:    AFP:     Quad:     NIPS: Antibody:  Anatomic [place]  Rubella:    Varicella: $RemoveBef'@[id number]'ehLvTYrQWd$ @ GTT Early: NA               Third trimester:  RPR:    Rhogam  HBsAg:    Vaccines TDAP:                       Flu Shot: Covid: HIV:    Baby Food Breast                               GBS:   GC/CT: Contraception  Pap: 2021 negative, hx LEEP   Past Surgical History:  Procedure Laterality Date  . COLPOSCOPY  12/2017  . LEEP     Family History  Problem Relation Age of Onset  . Breast cancer Mother 78  . Ovarian cancer Paternal Grandmother 71  . Cervical cancer Paternal Aunt 4       has contact  . Ovarian cancer Paternal Aunt   . Skin cancer Father   . Cervical cancer Maternal Grandmother        dx 77s  . Throat cancer Maternal Grandfather   . Throat cancer Paternal Grandfather    Social History   Tobacco Use   . Smoking status: Never Smoker  . Smokeless tobacco: Never Used  Vaping Use  . Vaping Use: Never used  Substance Use Topics  . Alcohol use: Yes    Comment: rarely  . Drug use: No    Current Outpatient Medications:  .  Cholecalciferol 75 MCG (3000 UT) TABS, , Disp: , Rfl:  .  ELDERBERRY PO, Take by mouth daily. (Patient not taking: No sig reported), Disp: , Rfl:  .  misoprostol (CYTOTEC) 200 MCG tablet, Place four tablets in between your gums and cheeks (two tablets on each side) as instructed OR insert four tablets vaginally, repeat every 3-hrs as needed (Patient not taking: Reported on 01/19/2021), Disp: 12 tablet, Rfl: 0 .  oxyCODONE (ROXICODONE) 5 MG immediate release tablet, Take 1 tablet (5 mg total) by mouth every 4 (four) hours as needed for severe pain. (Patient not taking: Reported on 01/19/2021), Disp: 10 tablet, Rfl: 0 .  Prenatal Vit-Fe Fumarate-FA (  PRENATAL VITAMINS PO), Take by mouth. (Patient not taking: Reported on 01/19/2021), Disp: , Rfl:  .  Probiotic Product (PROBIOTIC PO), Take by mouth daily. (Patient not taking: No sig reported), Disp: , Rfl:  .  progesterone (PROMETRIUM) 200 MG capsule, Take 1 capsule (200 mg total) by mouth at bedtime. Insert 1 capsule vaginally at bedtime (Patient not taking: No sig reported), Disp: 42 capsule, Rfl: 0 .  Turmeric (QC TUMERIC COMPLEX PO), Take by mouth daily. (Patient not taking: No sig reported), Disp: , Rfl:  Allergies: Symbicort [budesonide-formoterol fumarate]  Review of Systems  Constitutional: Negative for chills, fever and malaise/fatigue.  HENT: Negative for congestion, sinus pain and sore throat.   Eyes: Negative for blurred vision and pain.  Respiratory: Negative for cough and wheezing.   Cardiovascular: Negative for chest pain and leg swelling.  Gastrointestinal: Positive for abdominal pain. Negative for constipation, diarrhea, heartburn, nausea and vomiting.  Genitourinary: Negative for dysuria, frequency, hematuria and  urgency.  Musculoskeletal: Negative for back pain, joint pain, myalgias and neck pain.  Skin: Negative for itching and rash.  Neurological: Negative for dizziness, tremors and weakness.  Endo/Heme/Allergies: Does not bruise/bleed easily.  Psychiatric/Behavioral: Negative for depression. The patient is not nervous/anxious and does not have insomnia.     Objective: BP 100/60   Ht $R'5\' 7"'Ax$  (1.702 m)   Wt 149 lb (67.6 kg)   LMP 11/16/2020 (Exact Date) Comment: neg upt  BMI 23.34 kg/m   Filed Weights   01/19/21 1312  Weight: 149 lb (67.6 kg)   Physical Exam Constitutional:      General: She is not in acute distress.    Appearance: She is well-developed.  HENT:     Head: Normocephalic and atraumatic. No laceration.     Right Ear: Hearing normal.     Left Ear: Hearing normal.     Mouth/Throat:     Pharynx: Uvula midline.  Eyes:     Pupils: Pupils are equal, round, and reactive to light.  Neck:     Thyroid: No thyromegaly.  Cardiovascular:     Rate and Rhythm: Normal rate and regular rhythm.     Heart sounds: No murmur heard. No friction rub. No gallop.   Pulmonary:     Effort: Pulmonary effort is normal. No respiratory distress.     Breath sounds: Normal breath sounds. No wheezing.  Abdominal:     General: Bowel sounds are normal. There is no distension.     Palpations: Abdomen is soft.     Tenderness: There is no abdominal tenderness. There is no rebound.  Musculoskeletal:        General: Normal range of motion.     Cervical back: Normal range of motion and neck supple.  Neurological:     Mental Status: She is alert and oriented to person, place, and time.     Cranial Nerves: No cranial nerve deficit.  Skin:    General: Skin is warm and dry.  Psychiatric:        Judgment: Judgment normal.  Vitals reviewed.     Assessment: 1. Incomplete abortion   Plan D&C as therapeutic intervention    Alternatives and risks discussed  Plans IUD at 2-4 weeks for contraception     Does not desire future fertility  I have had a careful discussion with this patient about all the options available and the risk/benefits of each. I have fully informed this patient that surgery may subject her to a variety of discomforts and risks:  She understands that most patients have surgery with little difficulty, but problems can happen ranging from minor to fatal. These include nausea, vomiting, pain, bleeding, infection, poor healing, hernia, or formation of adhesions. Unexpected reactions may occur from any drug or anesthetic given. Unintended injury may occur to other pelvic or abdominal structures such as Fallopian tubes, ovaries, bladder, ureter (tube from kidney to bladder), or bowel. Nerves going from the pelvis to the legs may be injured. Any such injury may require immediate or later additional surgery to correct the problem. Excessive blood loss requiring transfusion is very unlikely but possible. Dangerous blood clots may form in the legs or lungs. Physical and sexual activity will be restricted in varying degrees for an indeterminate period of time but most often 2-6 weeks.  Finally, she understands that it is impossible to list every possible undesirable effect and that the condition for which surgery is done is not always cured or significantly improved, and in rare cases may be even worse.Ample time was given to answer all questions.  Barnett Applebaum, MD, [facility], [facility] 01/19/2021  1:44 PM

## 2021-01-20 ENCOUNTER — Encounter: Payer: Self-pay | Admitting: Obstetrics & Gynecology

## 2021-01-20 ENCOUNTER — Encounter
Admission: RE | Disposition: A | Discharge status: Home / Self Care | Payer: Self-pay | Source: Home / Self Care | Attending: Obstetrics & Gynecology

## 2021-01-20 ENCOUNTER — Other Ambulatory Visit: Payer: Self-pay

## 2021-01-20 ENCOUNTER — Ambulatory Visit: Payer: Medicaid Other | Admitting: Certified Registered"

## 2021-01-20 ENCOUNTER — Ambulatory Visit
Admission: RE | Admit: 2021-01-20 | Discharge: 2021-01-20 | Disposition: A | Discharge status: Home / Self Care | Payer: Medicaid Other | Attending: Obstetrics & Gynecology | Admitting: Obstetrics & Gynecology

## 2021-01-20 DIAGNOSIS — O031 Delayed or excessive hemorrhage following incomplete spontaneous abortion: Secondary | ICD-10-CM | POA: Diagnosis not present

## 2021-01-20 DIAGNOSIS — Z888 Allergy status to other drugs, medicaments and biological substances status: Secondary | ICD-10-CM | POA: Diagnosis not present

## 2021-01-20 DIAGNOSIS — O034 Incomplete spontaneous abortion without complication: Secondary | ICD-10-CM | POA: Diagnosis not present

## 2021-01-20 HISTORY — PX: DILATION AND EVACUATION: SHX1459

## 2021-01-20 LAB — CBC
HCT: 38 % (ref 36.0–46.0)
Hemoglobin: 12.7 g/dL (ref 12.0–15.0)
MCH: 29.5 pg (ref 26.0–34.0)
MCHC: 33.4 g/dL (ref 30.0–36.0)
MCV: 88.2 fL (ref 80.0–100.0)
Platelets: 255 10*3/uL (ref 150–400)
RBC: 4.31 MIL/uL (ref 3.87–5.11)
RDW: 13.2 % (ref 11.5–15.5)
WBC: 5.3 10*3/uL (ref 4.0–10.5)
nRBC: 0 % (ref 0.0–0.2)

## 2021-01-20 LAB — TYPE AND SCREEN
ABO/RH(D): O NEG
Antibody Screen: POSITIVE

## 2021-01-20 SURGERY — DILATION AND EVACUATION, UTERUS
Anesthesia: General

## 2021-01-20 MED ORDER — ACETAMINOPHEN 325 MG PO TABS: 650.0000 mg | ORAL_TABLET | ORAL | Status: DC | PRN

## 2021-01-20 MED ORDER — HYDROMORPHONE HCL 1 MG/ML IJ SOLN: 0.2500 mg | INTRAMUSCULAR | Status: DC | PRN

## 2021-01-20 MED ORDER — ACETAMINOPHEN 10 MG/ML IV SOLN
INTRAVENOUS | Status: DC | PRN
  Administered 2021-01-20: 1000 mg via INTRAVENOUS

## 2021-01-20 MED ORDER — LACTATED RINGERS IV SOLN: INTRAVENOUS | Status: DC

## 2021-01-20 MED ORDER — DEXAMETHASONE SODIUM PHOSPHATE 10 MG/ML IJ SOLN
INTRAMUSCULAR | Status: DC | PRN
  Administered 2021-01-20: 10 mg via INTRAVENOUS

## 2021-01-20 MED ORDER — PROPOFOL 10 MG/ML IV BOLUS
INTRAVENOUS | Status: DC | PRN
  Administered 2021-01-20: 200 mg via INTRAVENOUS

## 2021-01-20 MED ORDER — CHLORHEXIDINE GLUCONATE 0.12 % MT SOLN: 15.0000 mL | Freq: Once | OROMUCOSAL | Status: AC

## 2021-01-20 MED ORDER — PROPOFOL 10 MG/ML IV BOLUS
INTRAVENOUS | Status: AC
  Filled 2021-01-20: qty 20

## 2021-01-20 MED ORDER — FENTANYL CITRATE (PF) 100 MCG/2ML IJ SOLN
INTRAMUSCULAR | Status: DC | PRN
  Administered 2021-01-20 (×2): 25 ug via INTRAVENOUS

## 2021-01-20 MED ORDER — LIDOCAINE HCL (CARDIAC) PF 100 MG/5ML IV SOSY
PREFILLED_SYRINGE | INTRAVENOUS | Status: DC | PRN
  Administered 2021-01-20: 100 mg via INTRAVENOUS

## 2021-01-20 MED ORDER — IBUPROFEN 400 MG PO TABS
400.0000 mg | ORAL_TABLET | Freq: Four times a day (QID) | ORAL | Status: DC | PRN
  Filled 2021-01-20: qty 1

## 2021-01-20 MED ORDER — IBUPROFEN 400 MG PO TABS: 400.0000 mg | ORAL_TABLET | Freq: Four times a day (QID) | ORAL | 0 refills | Status: DC | PRN

## 2021-01-20 MED ORDER — METHYLERGONOVINE MALEATE 0.2 MG PO TABS: 0.2000 mg | ORAL_TABLET | Freq: Four times a day (QID) | ORAL | 0 refills | Status: DC

## 2021-01-20 MED ORDER — DOXYCYCLINE HYCLATE 100 MG PO TABS: 100.0000 mg | ORAL_TABLET | Freq: Two times a day (BID) | ORAL | 0 refills | Status: DC

## 2021-01-20 MED ORDER — MEPERIDINE HCL 50 MG/ML IJ SOLN: 6.2500 mg | INTRAMUSCULAR | Status: DC | PRN

## 2021-01-20 MED ORDER — MIDAZOLAM HCL 2 MG/2ML IJ SOLN
INTRAMUSCULAR | Status: AC
  Filled 2021-01-20: qty 2

## 2021-01-20 MED ORDER — METHYLERGONOVINE MALEATE 0.2 MG PO TABS
0.2000 mg | ORAL_TABLET | Freq: Four times a day (QID) | ORAL | Status: DC
  Filled 2021-01-20: qty 1

## 2021-01-20 MED ORDER — DROPERIDOL 2.5 MG/ML IJ SOLN
0.6250 mg | Freq: Once | INTRAMUSCULAR | Status: DC | PRN
  Filled 2021-01-20: qty 2

## 2021-01-20 MED ORDER — CHLORHEXIDINE GLUCONATE 0.12 % MT SOLN
OROMUCOSAL | Status: AC
  Administered 2021-01-20: 15 mL via OROMUCOSAL
  Filled 2021-01-20: qty 15

## 2021-01-20 MED ORDER — OXYCODONE HCL 5 MG PO TABS: 5.0000 mg | ORAL_TABLET | Freq: Once | ORAL | Status: DC | PRN

## 2021-01-20 MED ORDER — LORAZEPAM 2 MG/ML IJ SOLN: 1.0000 mg | Freq: Once | INTRAMUSCULAR | Status: DC | PRN

## 2021-01-20 MED ORDER — OXYCODONE HCL 5 MG/5ML PO SOLN
5.0000 mg | Freq: Once | ORAL | Status: DC | PRN
Start: 2021-01-20 — End: 2021-01-20

## 2021-01-20 MED ORDER — ORAL CARE MOUTH RINSE: 15.0000 mL | Freq: Once | OROMUCOSAL | Status: AC

## 2021-01-20 MED ORDER — MIDAZOLAM HCL 2 MG/2ML IJ SOLN
INTRAMUSCULAR | Status: DC | PRN
  Administered 2021-01-20: 2 mg via INTRAVENOUS

## 2021-01-20 MED ORDER — ACETAMINOPHEN 10 MG/ML IV SOLN
INTRAVENOUS | Status: AC
  Filled 2021-01-20: qty 100

## 2021-01-20 MED ORDER — DOXYCYCLINE HYCLATE 100 MG PO TABS
100.0000 mg | ORAL_TABLET | Freq: Two times a day (BID) | ORAL | Status: DC
  Filled 2021-01-20: qty 1

## 2021-01-20 MED ORDER — PROMETHAZINE HCL 25 MG/ML IJ SOLN: 6.2500 mg | INTRAMUSCULAR | Status: DC | PRN

## 2021-01-20 MED ORDER — MORPHINE SULFATE (PF) 2 MG/ML IV SOLN: 1.0000 mg | INTRAVENOUS | Status: DC | PRN

## 2021-01-20 MED ORDER — POVIDONE-IODINE 10 % EX SWAB: 2.0000 "application " | Freq: Once | CUTANEOUS | Status: DC

## 2021-01-20 MED ORDER — ACETAMINOPHEN 650 MG RE SUPP
650.0000 mg | RECTAL | Status: DC | PRN
  Filled 2021-01-20: qty 1

## 2021-01-20 MED ORDER — FENTANYL CITRATE (PF) 100 MCG/2ML IJ SOLN
INTRAMUSCULAR | Status: AC
  Filled 2021-01-20: qty 2

## 2021-01-20 SURGICAL SUPPLY — 24 items
BAG COUNTER SPONGE EZ (MISCELLANEOUS) ×2 IMPLANT
BAG SPNG 4X4 CLR HAZ (MISCELLANEOUS) ×1
CANISTER SUC SOCK COL 7IN (MISCELLANEOUS) ×2 IMPLANT
CATH ROBINSON RED A/P 16FR (CATHETERS) ×2 IMPLANT
COVER WAND RF STERILE (DRAPES) ×2 IMPLANT
FILTER UTR ASPR SPEC (MISCELLANEOUS) ×1 IMPLANT
FLTR UTR ASPR SPEC (MISCELLANEOUS) ×2
GLOVE SURG ENC MOIS LTX SZ8 (GLOVE) ×8 IMPLANT
GOWN STRL REUS W/ TWL LRG LVL3 (GOWN DISPOSABLE) ×2 IMPLANT
GOWN STRL REUS W/ TWL XL LVL3 (GOWN DISPOSABLE) ×1 IMPLANT
GOWN STRL REUS W/TWL LRG LVL3 (GOWN DISPOSABLE) ×4
GOWN STRL REUS W/TWL XL LVL3 (GOWN DISPOSABLE) ×2
KIT BERKELEY 1ST TRIMESTER 3/8 (MISCELLANEOUS) ×2 IMPLANT
KIT TURNOVER CYSTO (KITS) ×2 IMPLANT
MANIFOLD NEPTUNE II (INSTRUMENTS) ×2 IMPLANT
NS IRRIG 500ML POUR BTL (IV SOLUTION) ×2 IMPLANT
PACK DNC HYST (MISCELLANEOUS) ×2 IMPLANT
PAD OB MATERNITY 4.3X12.25 (PERSONAL CARE ITEMS) ×2 IMPLANT
PAD PREP 24X41 OB/GYN DISP (PERSONAL CARE ITEMS) ×2 IMPLANT
SET BERKELEY SUCTION TUBING (SUCTIONS) ×2 IMPLANT
TOWEL OR 17X26 4PK STRL BLUE (TOWEL DISPOSABLE) ×2 IMPLANT
VACURETTE 10 RIGID CVD (CANNULA) IMPLANT
VACURETTE 12 RIGID CVD (CANNULA) IMPLANT
VACURETTE 8 RIGID CVD (CANNULA) ×2 IMPLANT

## 2021-01-20 NOTE — Discharge Instructions (Addendum)
Dilation and Curettage or Vacuum Curettage, Care After This sheet gives you information about how to care for yourself after your procedure. Your health care provider may also give you more specific instructions. If you have problems or questions, contact your health care provider. What can I expect after the procedure? After the procedure, it is common to have:  Mild pain or cramping.  Some vaginal bleeding or spotting. These may last for up to 2 weeks after your procedure. Follow these instructions at home: Medicines  Take over-the-counter and prescription medicines only as told by your health care provider. This is especially important if you take blood-thinning medicine.  Ask your health care provider if the medicine prescribed to you requires you to avoid driving or using machinery. Activity  If you were given a sedative during the procedure, it can affect you for several hours. Do not drive or operate machinery until your health care provider says that it is safe.  Rest as told by your health care provider.  Avoid sitting for a long time without moving. Get up to take short walks every 1-2 hours. This is important to improve blood flow and breathing. Ask for help if you feel weak or unsteady.  Do not lift anything that is heavier than 10 lb (4.5 kg), or the limit that you are told, until your health care provider says that it is safe.  Return to your normal activities as told by your health care provider. Ask your health care provider what activities are safe for you.   Lifestyle For at least 2 weeks, or as long as told by your health care provider, do not:  Douche.  Use tampons.  Have sex. General instructions  Wear compression stockings as told by your health care provider. These stockings help to prevent blood clots and reduce swelling in your legs.  It is up to you to get the results of your procedure. Ask your health care provider, or the department that is doing the  procedure, when your results will be ready.  Keep all follow-up visits as told by your health care provider. This is important. Contact a health care provider if:  You have severe cramps that get worse or that do not get better with medicine.  You have severe pain in the abdomen.  You cannot drink fluids without vomiting.  You develop pain in a different area of your pelvis.  You have bad-smelling discharge from the vagina.  You have a rash. Get help right away if:  You have vaginal bleeding that soaks more than one sanitary pad in 1 hour for 2 hours in a row, or you pass large clots from your vagina.  You have a fever that is above 100.4F (38.0C).  Your abdomen feels very tender or hard.  You have chest pain.  You have shortness of breath.  You feel dizzy or light-headed, or you faint.  You have pain in your neck or shoulder area. These symptoms may represent a serious problem that is an emergency. Do not wait to see if the symptoms will go away. Get medical help right away. Call your local emergency services (911 in the [place] Do not drive yourself to the hospital. Summary  After your procedure, it is common to have mild pain and bleeding or spotting that may last for up to 2 weeks.  Rest as told. Avoid sitting for a long time without moving. Get up to take short walks every 1-2 hours.  Do not lift   anything that is heavier than 10 lb (4.5 kg), or the limit that you are told.  Monitor yourself for any complications that may develop after the procedure.  Keep all follow-up visits as told by your health care provider. Know the symptoms for which you should get help right away. This information is not intended to replace advice given to you by your health care provider. Make sure you discuss any questions you have with your health care provider. Document Revised: 12/25/2019 Document Reviewed: 12/25/2019 Elsevier Patient Education  2021 Elsevier Inc.   AMBULATORY  SURGERY  DISCHARGE INSTRUCTIONS   1) The drugs that you were given will stay in your system until tomorrow so for the next 24 hours you should not:  A) Drive an automobile B) Make any legal decisions C) Drink any alcoholic beverage   2) You may resume regular meals tomorrow.  Today it is better to start with liquids and gradually work up to solid foods.  You may eat anything you prefer, but it is better to start with liquids, then soup and crackers, and gradually work up to solid foods.   3) Please notify your doctor immediately if you have any unusual bleeding, trouble breathing, redness and pain at the surgery site, drainage, fever, or pain not relieved by medication.    4) Additional Instructions:   Please contact your physician with any problems or Same Day Surgery at [phone], Monday through Friday 6 am to 4 pm, or [facility] number at [phone]. 

## 2021-01-20 NOTE — Anesthesia Postprocedure Evaluation (Signed)
Anesthesia Post Note  Patient: Engineer, civil (consulting)  Procedure(s) Performed: DILATATION AND EVACUATION (N/A )  Patient location during evaluation: PACU Anesthesia Type: General Level of consciousness: awake Pain management: pain level controlled Vital Signs Assessment: post-procedure vital signs reviewed and stable Cardiovascular status: stable Postop Assessment: no apparent nausea or vomiting Anesthetic complications: no   No complications documented.   Last Vitals:  Vitals:   01/20/21 0919 01/20/21 0930  BP: 122/77 104/70  Pulse: 84 65  Resp: 18 12  Temp: (!) 36.2 C   SpO2: 100% 100%    Last Pain:  Vitals:   01/20/21 0930  TempSrc:   PainSc: 0-No pain                 Emilio Math

## 2021-01-20 NOTE — Op Note (Signed)
  Operative Note  01/20/2021 9:15 AM  PRE-OP DIAGNOSIS: incomplete abortion   POST-OP DIAGNOSIS: same  SURGEON: Annamarie Major, MD, FACOG  ANESTHESIA: Choice   PROCEDURE: Procedure(s): DILATATION AND EVACUATION   ESTIMATED BLOOD LOSS: Minimal   SPECIMENS: POC   COMPLICATIONS: none  DISPOSITION: PACU - hemodynamically stable.  CONDITION: stable  FINDINGS: Exam under anesthesia revealed a small uterus without palpable adnexal masses.   INDICATION FOR PROCEDURE: Incomplete abortion with continued bleeding.  PROCEDURE IN DETAIL: After informed consent was obtained, the patient was taken to the operating room where anesthesia was obtained without difficulty. The patient was positioned in the dorsal lithotomy position with [employer]. Time out was performed and an exam under anesthesia was performed. The vagina, perineum, and lower abdomen were prepped and draped in a normal sterile fashion. The bladder was emptied with an I&O catheter. A speculum was placed into the vagina and the cervix was grasped with a single toothed tenaculum. The uterus was sounded to 8cm.  The cervix was gently dilated to 20 [place] with  [employer] dilators. The suction was then tested and found to be adequate, and a 48mm rigid suction cannula was advanced into the uterine cavity. The suction was activated and the contents of the uterus were aspirated until no further tissue was obtained. The uterus was then curetted to gritty texture throughout.  At the end of the procedure bleeding was noted to beMinimal.  All instruments were then removed from the vagina.The patient tolerated the procedure well. All sponge, instrument, and needle counts were correct. The patient was taken to the recovery room in good condition.   Annamarie Major, MD, [facility], [facility] 01/20/2021  9:15 AM

## 2021-01-20 NOTE — Interval H&P Note (Signed)
History and Physical Interval Note:  01/20/2021 8:12 AM  Rachel Montes  has presented today for surgery, with the diagnosis of incomplete abortion.  The various methods of treatment have been discussed with the patient and family. After consideration of risks, benefits and other options for treatment, the patient has consented to  Procedure(s): DILATATION AND EVACUATION (N/A) as a surgical intervention.  The patient's history has been reviewed, patient examined, no change in status, stable for surgery.  I have reviewed the patient's chart and labs.  Questions were answered to the patient's satisfaction.     Letitia Libra

## 2021-01-20 NOTE — Anesthesia Preprocedure Evaluation (Signed)
Anesthesia Evaluation  Patient identified by MRN, date of birth, ID band Patient awake    Reviewed: Allergy & Precautions, H&P , NPO status , Patient's Chart, lab work & pertinent test results  Airway Mallampati: II       Dental no notable dental hx. (+) Teeth Intact   Pulmonary shortness of breath,    Pulmonary exam normal breath sounds clear to auscultation       Cardiovascular negative cardio ROS Normal cardiovascular exam Rhythm:Regular Rate:Normal     Neuro/Psych negative neurological ROS  negative psych ROS   GI/Hepatic negative GI ROS, Neg liver ROS,   Endo/Other  negative endocrine ROS  Renal/GU negative Renal ROS  negative genitourinary   Musculoskeletal negative musculoskeletal ROS (+)   Abdominal   Peds negative pediatric ROS (+)  Hematology negative hematology ROS (+)   Anesthesia Other Findings   Reproductive/Obstetrics negative OB ROS                             Anesthesia Physical Anesthesia Plan  ASA: II  Anesthesia Plan: General   Post-op Pain Management:    Induction: Intravenous  PONV Risk Score and Plan: 3 and Ondansetron and Dexamethasone  Airway Management Planned: LMA  Additional Equipment:   Intra-op Plan:   Post-operative Plan: Extubation in OR  Informed Consent: I have reviewed the patients History and Physical, chart, labs and discussed the procedure including the risks, benefits and alternatives for the proposed anesthesia with the patient or authorized representative who has indicated his/her understanding and acceptance.     Dental advisory given  Plan Discussed with: CRNA, Anesthesiologist and Surgeon  Anesthesia Plan Comments:         Anesthesia Quick Evaluation

## 2021-01-20 NOTE — Transfer of Care (Signed)
Immediate Anesthesia Transfer of Care Note  Patient: Engineer, civil (consulting)  Procedure(s) Performed: DILATATION AND EVACUATION (N/A )  Patient Location: PACU  Anesthesia Type:General  Level of Consciousness: awake, alert  and oriented  Airway & Oxygen Therapy: Patient Spontanous Breathing and Patient connected to face mask oxygen  Post-op Assessment: Report given to RN and Post -op Vital signs reviewed and stable  Post vital signs: Reviewed and stable  Last Vitals:  Vitals Value Taken Time  BP    Temp    Pulse    Resp    SpO2      Last Pain:  Vitals:   01/20/21 0814  TempSrc: Oral  PainSc: 0-No pain         Complications: No complications documented.

## 2021-01-20 NOTE — OR Nursing (Signed)
No bleeding.  Awake alert oreineted p 58.  B/p 93/59.  No dizziness or light headness.  Completed 1000 cc of LR.  DC instructions to husband.  REady for DC homme.

## 2021-01-21 ENCOUNTER — Telehealth: Payer: Self-pay

## 2021-01-21 ENCOUNTER — Encounter: Payer: Self-pay | Admitting: Obstetrics & Gynecology

## 2021-01-21 LAB — SURGICAL PATHOLOGY

## 2021-01-21 NOTE — Telephone Encounter (Signed)
Pt states she feels fine today.  Adv it could have been the anesthesia wearing off.  Pt agreed.

## 2021-01-21 NOTE — Telephone Encounter (Signed)
Pt called the after hour nurse 01/20/21 8:03pm c/o had D&C today; has been light headed and dizzy.  No bleeding.  Only taking IBU and doxycycline.  [phone] After hour nurse adv pt to be seen within 24hrs; drink fluids. LMTC.

## 2021-01-22 NOTE — Telephone Encounter (Signed)
Pt has been seen 

## 2021-01-22 NOTE — Telephone Encounter (Addendum)
Patient aware. Doxycycline discontinued d/t side effects on med list.

## 2021-01-22 NOTE — Addendum Note (Signed)
Addended by: Kathlene Cote on: 01/22/2021 02:35 PM   Modules accepted: Orders

## 2021-01-22 NOTE — Telephone Encounter (Signed)
Patient reports she was fine yesterday morning, but about mid-afternoon, she began to feel light headed and dizzy again. She couldn't do anything. She was nauseas. She's having blurred vision now and continued dizziness/nausea. Inquiring if side effect of doxycycline? [phone]

## 2021-01-22 NOTE — Telephone Encounter (Signed)
Perhaps.  Let her know to stop taking this ABX. Also hydate well and make sure she is eating (hypoglycemia a cause as well).

## 2021-01-23 ENCOUNTER — Telehealth: Payer: Self-pay | Admitting: Pulmonary Disease

## 2021-01-23 NOTE — Telephone Encounter (Signed)
Called and spoke with patient about scheduling an in office appointment for today. Patient reports cramping stopped after 9 pm last night znd isn't having any pain at this time. Patient denies needing  to be seen at this time

## 2021-01-23 NOTE — Telephone Encounter (Signed)
Lm for patient to remind her of covid test prior to PFT  [facility]/20[facility]

## 2021-01-23 NOTE — Telephone Encounter (Signed)
After hour nurse adv pt to be seen within 24hrs.

## 2021-01-23 NOTE — Telephone Encounter (Signed)
Pt called after hour nurse 01/22/21 8:58pm c/o had D&C on Tuesday, had no discomfort or pain on Tuesday or Wednesday, today has had abdominal cramping which makes her not want to eat or do her normal activities.  States pain level is 5-6/10, took IBU which didn't help at all.  Denies bleeding or fever.  States she also feels lightheaded and dizzy.  [phone]

## 2021-01-26 ENCOUNTER — Other Ambulatory Visit: Admission: RE | Admit: 2021-01-26 | Payer: Medicaid Other | Source: Ambulatory Visit

## 2021-01-26 NOTE — Telephone Encounter (Signed)
Lm x2 for patient.  Will close encounter per office protocol.   

## 2021-01-27 ENCOUNTER — Ambulatory Visit: Payer: Medicaid Other | Admitting: Obstetrics

## 2021-01-27 ENCOUNTER — Ambulatory Visit: Payer: Medicaid Other | Attending: Pulmonary Disease

## 2021-01-27 ENCOUNTER — Other Ambulatory Visit: Payer: Medicaid Other

## 2021-01-27 ENCOUNTER — Ambulatory Visit: Payer: Medicaid Other | Admitting: Obstetrics & Gynecology

## 2021-01-30 ENCOUNTER — Other Ambulatory Visit: Payer: Self-pay | Admitting: Cardiovascular Disease

## 2021-01-30 DIAGNOSIS — R0600 Dyspnea, unspecified: Secondary | ICD-10-CM

## 2021-02-02 ENCOUNTER — Encounter: Payer: Self-pay | Admitting: Obstetrics & Gynecology

## 2021-02-02 ENCOUNTER — Ambulatory Visit (INDEPENDENT_AMBULATORY_CARE_PROVIDER_SITE_OTHER): Payer: Medicaid Other | Admitting: Obstetrics & Gynecology

## 2021-02-02 ENCOUNTER — Other Ambulatory Visit: Payer: Self-pay

## 2021-02-02 VITALS — BP 100/70 | Ht 67.0 in | Wt 152.0 lb

## 2021-02-02 DIAGNOSIS — Z30430 Encounter for insertion of intrauterine contraceptive device: Secondary | ICD-10-CM | POA: Diagnosis not present

## 2021-02-02 NOTE — Progress Notes (Signed)
  IUD PROCEDURE NOTE:  Rachel Montes is a 35 y.o. [id number] here for follow up after miscarriage, and for IUD insertion. No GYN concerns.  No bleeding or pain after D&C 2 weeks ago.   Has not had period yet.  Last pap smear was normal.  IUD Insertion Procedure Note Patient identified, informed consent performed, consent signed.   Discussed risks of irregular bleeding, cramping, infection, malpositioning or misplacement of the IUD outside the uterus which may require further procedure such as laparoscopy, risk of failure <1%.  Risk of expulsion discussed.  Pt prefers Paraguard non-hormonal IUD.  A bimanual exam showed the uterus to be midposition.  Speculum placed in the vagina.  Cervix visualized.  Cleaned with Betadine x 2.  Grasped anteriorly with a single tooth tenaculum.  Uterus sounded to 7 cm.   Paragard IUD placed per manufacturer's recommendations.  Strings trimmed to 3 cm. Tenaculum was removed, good hemostasis noted.  Patient tolerated procedure well.   Patient was given post-procedure instructions.  She was advised to have backup contraception for one week.  Patient was also asked to check IUD strings periodically.  Pt is moving to [place] next week (and is given instructions for follow up there based on sx's).  Annamarie Major, MD, [facility], [facility] 02/02/2021  2:37 PM

## 2021-02-02 NOTE — Patient Instructions (Signed)

## 2021-02-03 ENCOUNTER — Ambulatory Visit: Payer: Medicaid Other | Admitting: Pulmonary Disease

## 2021-02-03 ENCOUNTER — Other Ambulatory Visit: Payer: Medicaid Other

## 2021-02-05 ENCOUNTER — Other Ambulatory Visit: Payer: Self-pay

## 2021-02-05 ENCOUNTER — Ambulatory Visit (INDEPENDENT_AMBULATORY_CARE_PROVIDER_SITE_OTHER): Payer: Medicaid Other | Admitting: Obstetrics & Gynecology

## 2021-02-05 ENCOUNTER — Encounter: Payer: Self-pay | Admitting: Obstetrics & Gynecology

## 2021-02-05 VITALS — BP 120/80 | Ht 67.0 in | Wt 150.0 lb

## 2021-02-05 DIAGNOSIS — T8384XA Pain from genitourinary prosthetic devices, implants and grafts, initial encounter: Secondary | ICD-10-CM

## 2021-02-05 DIAGNOSIS — Z30432 Encounter for removal of intrauterine contraceptive device: Secondary | ICD-10-CM

## 2021-02-05 MED ORDER — MEDROXYPROGESTERONE ACETATE 150 MG/ML IM SUSP: 150.0000 mg | INTRAMUSCULAR | 0 refills | Status: AC

## 2021-02-05 NOTE — Progress Notes (Signed)
  History of Present Illness:  Rachel Montes is a 35 y.o. that had a Paraguard IUD placed approximately 3 days ago. Since that time, she states that she has had vaginal pain and cramping, and feels she does not want IUD any longer.  She is moving soon and does not want to deal w it possibly worsening and she be in a new environment.  The following portions of the patient's history were reviewed and updated as appropriate: allergies, current medications, past family history, past medical history, past social history, past surgical history and problem list.  Patient Active Problem List   Diagnosis Date Noted  . Incomplete abortion 01/20/2021  . Multigravida of advanced maternal age in first trimester 12/24/2020  . Reactive airway disease 11/18/2020  . Shortness of breath 11/18/2020  . At high risk for breast cancer 08/19/2020  . History of COVID-19 04/10/2020  . Genetic testing 01/24/2020  . Family history of throat cancer   . Family history of ovarian cancer 07/06/2019  . Family history of breast cancer in mother 07/06/2019  . Rh negative state in antepartum period 12/27/2017   Medications:  Current Outpatient Medications on File Prior to Visit  Medication Sig Dispense Refill  . Cholecalciferol 75 MCG (3000 UT) TABS     . ELDERBERRY PO Take by mouth daily.    . Prenatal Vit-Fe Fumarate-FA (PRENATAL VITAMINS PO) Take by mouth.    . Probiotic Product (PROBIOTIC PO) Take by mouth daily.    . Turmeric (QC TUMERIC COMPLEX PO) Take by mouth daily.     No current facility-administered medications on file prior to visit.   Allergies: is allergic to symbicort [budesonide-formoterol fumarate].  Physical Exam:  BP 120/80   Ht 5\' 7"  (1.702 m)   Wt 150 lb (68 kg)   BMI 23.49 kg/m  Body mass index is 23.49 kg/m. Constitutional: Well nourished, well developed female in no acute distress.  Abdomen: diffusely non tender to palpation, non distended, and no masses, hernias Neuro: Grossly  intact Psych:  Normal mood and affect.    Pelvic exam:  Two IUD strings present seen coming from the cervical os.  Tip of IUD itself at level on ext os. EGBUS, vaginal vault and cervix: within normal limits  IUD Removal Strings of IUD identified and grasped.  IUD removed without problem.  Pt tolerated this well.  IUD noted to be intact.  Assessment: IUD Removal  Plan: IUD removed and plan for contraception is Depo-Provera.    Rx called in.  To get shot prior to moving to She was amenable to this plan.  [place], M.D. 02/05/2021 2:36 PM

## 2021-02-06 ENCOUNTER — Ambulatory Visit: Payer: Medicaid Other

## 2021-12-31 IMAGING — CT CT ANGIO CHEST
2 of 6 series · 19 of 46 positions shown · IV contrast (APPLIED)
Comparison: Chest radiograph dated 11/27/2020.

CLINICAL DATA: 34-year-old female with chest pain and shortness of
breath. Concern for pulmonary embolism.

EXAM:
CT ANGIOGRAPHY CHEST WITH CONTRAST
TECHNIQUE: Multidetector CT imaging of the chest was performed using the
standard protocol during bolus administration of intravenous
contrast. Multiplanar CT image reconstructions and MIPs were
obtained to evaluate the vascular anatomy.
CONTRAST:  75mL OMNIPAQUE IOHEXOL 350 MG/ML SOLN

[Series 5: thins · axial · 0.62mm/px · z∈[-348,-77]mm · 16 of 297 slices shown]
[im 13/297  lung]
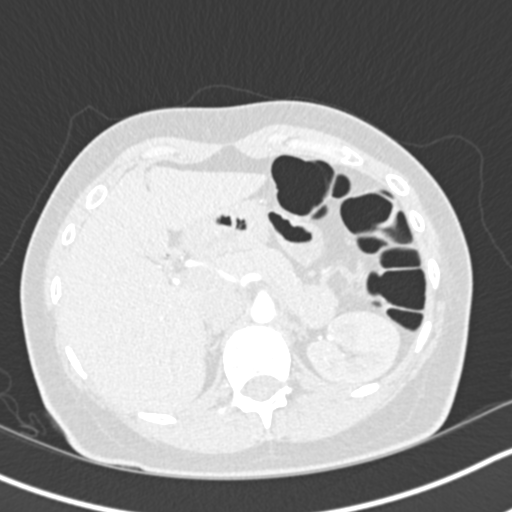
[im 39/297  soft-tissue]
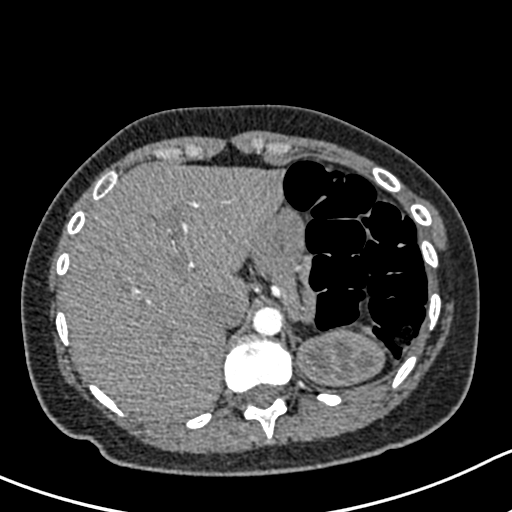
[im 52/297  lung]
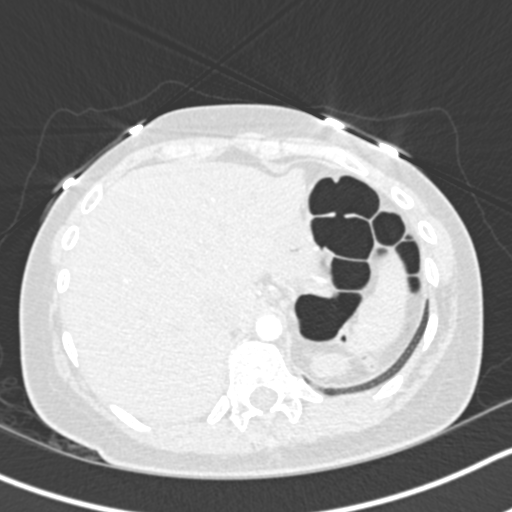
[im 65/297  soft-tissue]
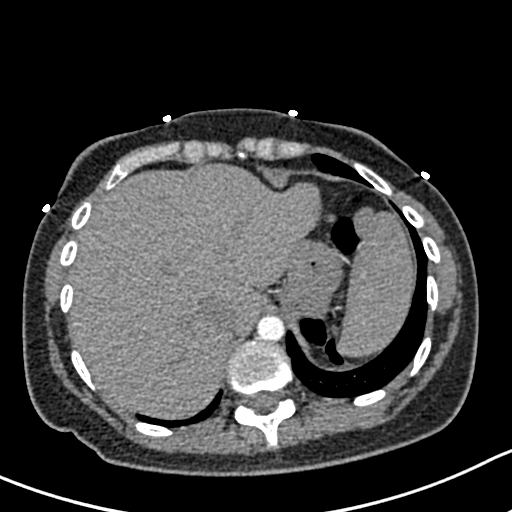
[im 91/297  lung]
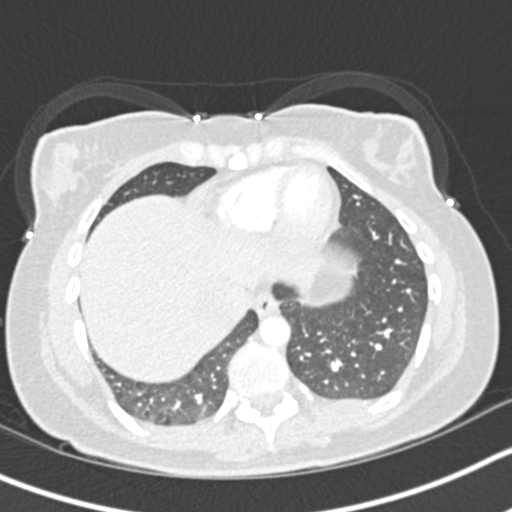
[im 103/297  soft-tissue]
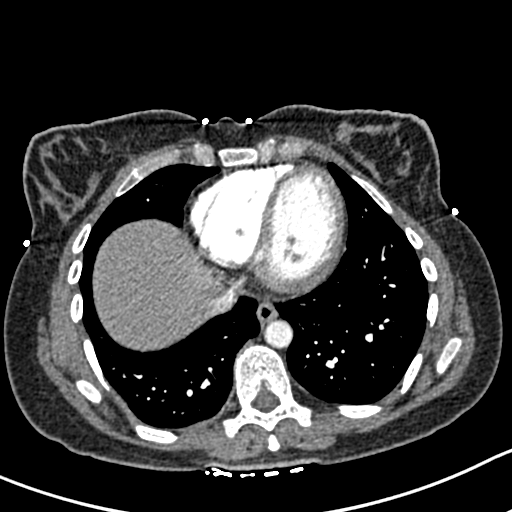
[im 116/297  lung]
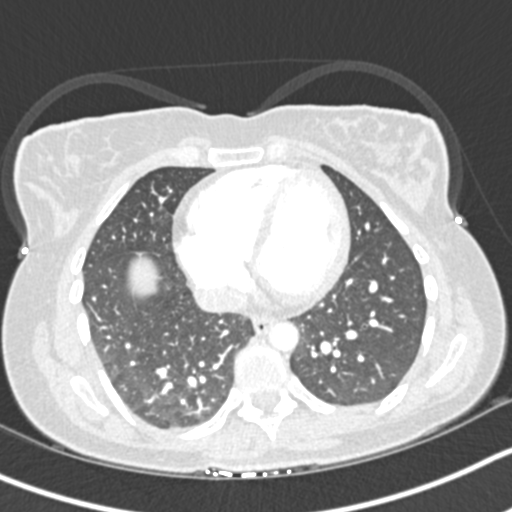
[im 142/297  soft-tissue]
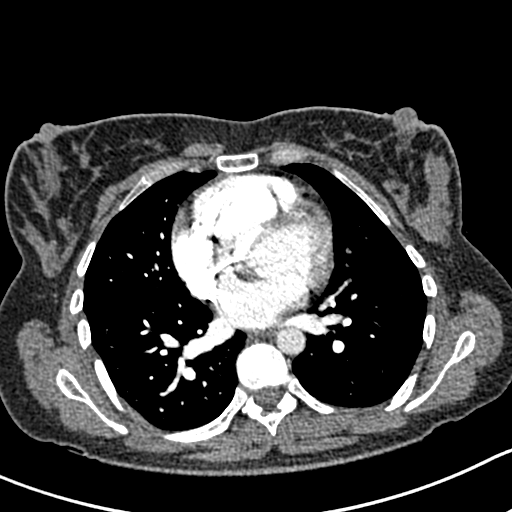
[im 155/297  lung]
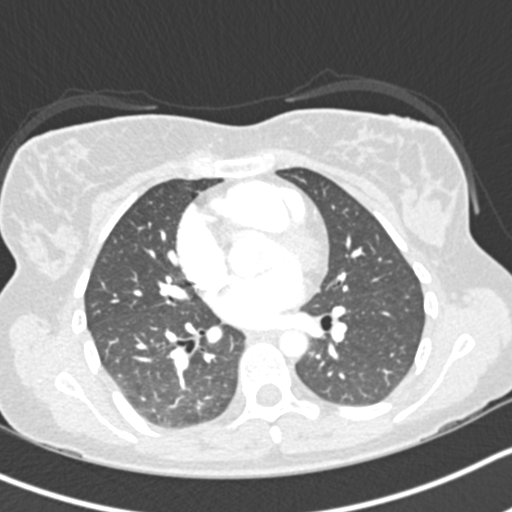
[im 181/297  soft-tissue]
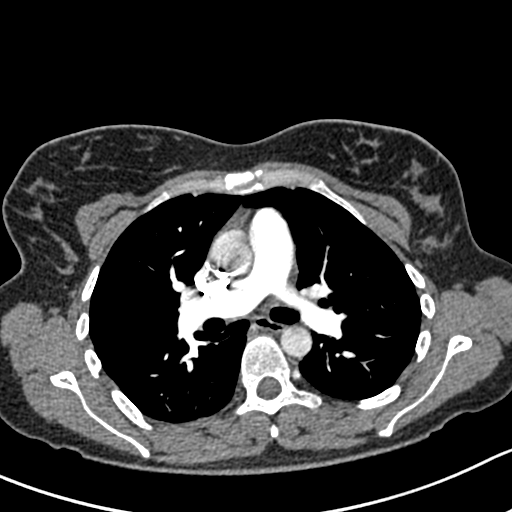
[im 194/297  lung]
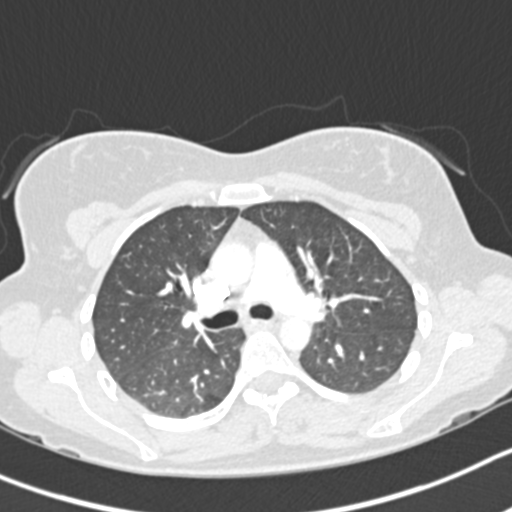
[im 206/297  soft-tissue]
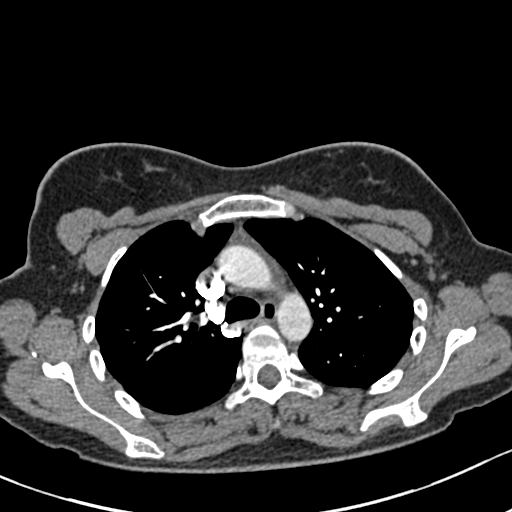
[im 232/297  lung]
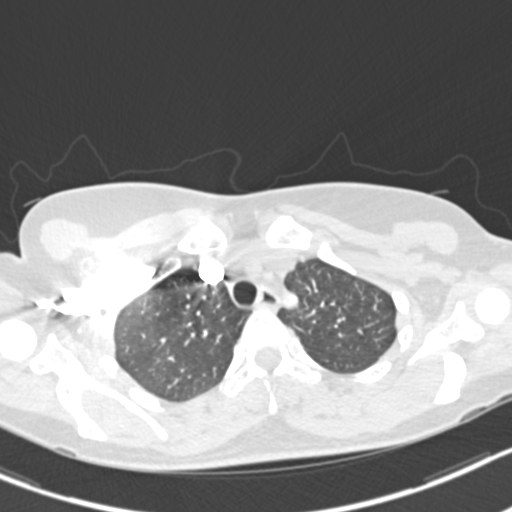
[im 245/297  soft-tissue]
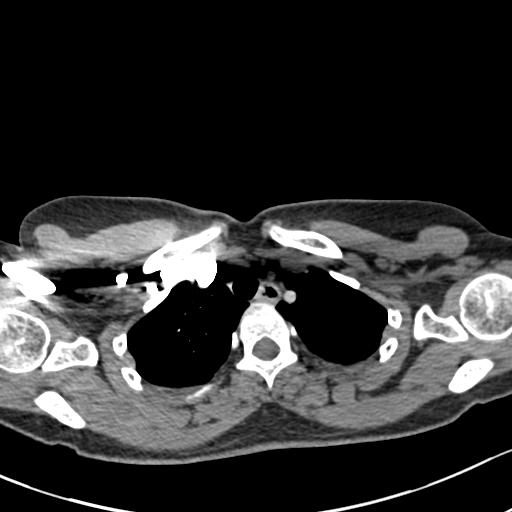
[im 258/297  lung]
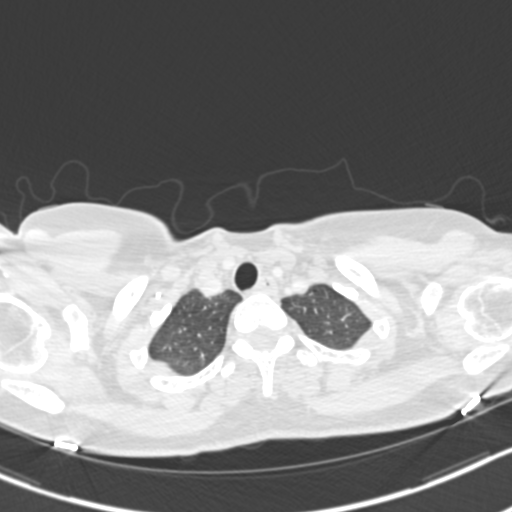
[im 284/297  soft-tissue]
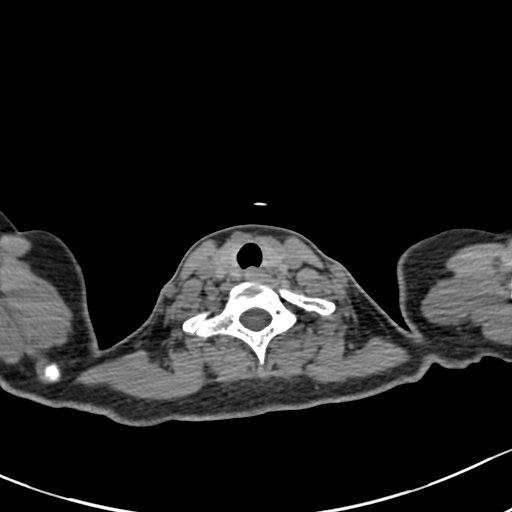

[Series 7: coronal mpr · coronal · 0.58mm/px · 3 of 79 slices shown]
[im 20/79  soft-tissue]
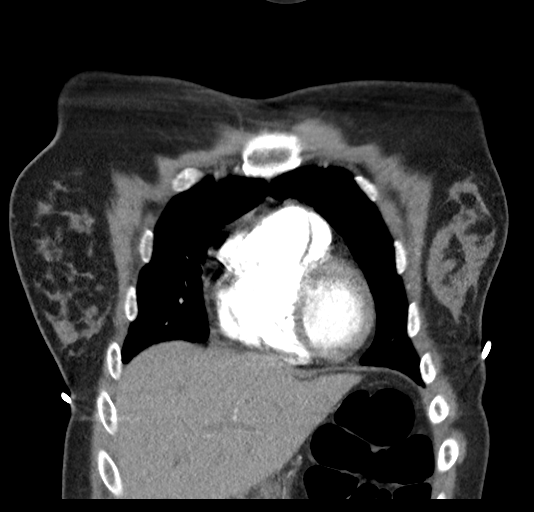
[im 40/79  soft-tissue]
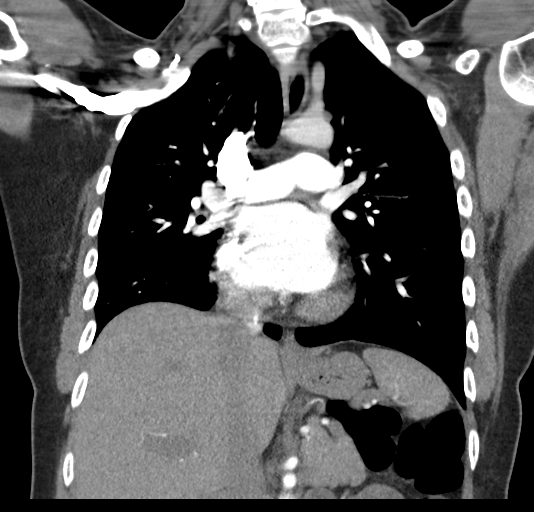
[im 59/79  soft-tissue]
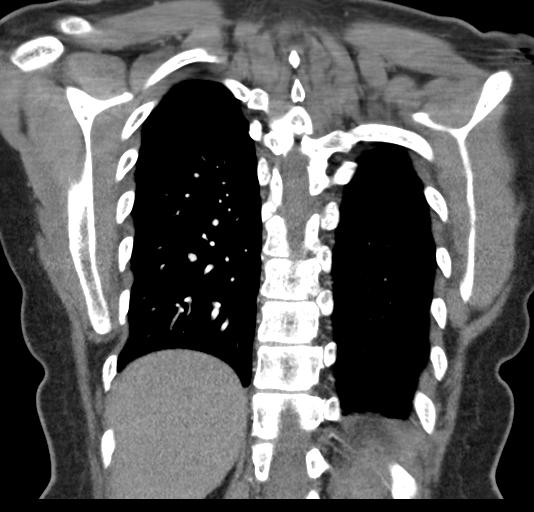

[19 of 46 positions shown; findings below may reference images not displayed]

FINDINGS: Cardiovascular: There is no cardiomegaly or pericardial effusion.
The thoracic aorta is unremarkable. No pulmonary artery embolus
identified.

Mediastinum/Nodes: There is no hilar or mediastinal adenopathy. The
esophagus and the thyroid gland are grossly unremarkable. No
mediastinal fluid collection.

Lungs/Pleura: The lungs are clear. There is no pleural effusion
pneumothorax. The central airways are patent.

Upper Abdomen: No acute abnormality.

Musculoskeletal: No chest wall abnormality. No acute or significant
osseous findings.

Review of the MIP images confirms the above findings.
IMPRESSION: No acute intrathoracic pathology. No CT evidence of pulmonary
embolism.

## 2022-02-10 IMAGING — US US OB TRANSVAGINAL
1 of 2 series · 13 of 28 positions shown · non-contrast
Comparison: 01/09/2021

CLINICAL DATA: Vaginal bleeding for 4 days

EXAM:
TRANSVAGINAL OB ULTRASOUND
TECHNIQUE: Transvaginal ultrasound was performed for complete evaluation of the
gestation as well as the maternal uterus, adnexal regions, and
pelvic cul-de-sac.

[Series 1: us ob transvaginal · 71 acquisitions, 13 frames shown]
[im 3/71]
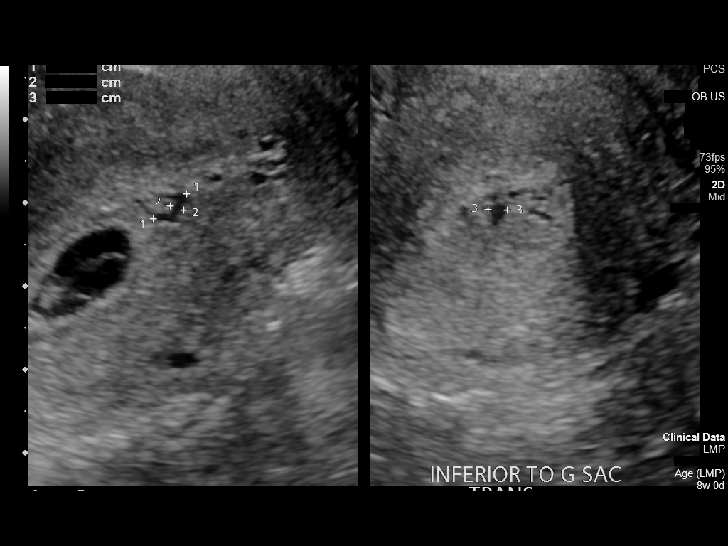
[im 9/71]
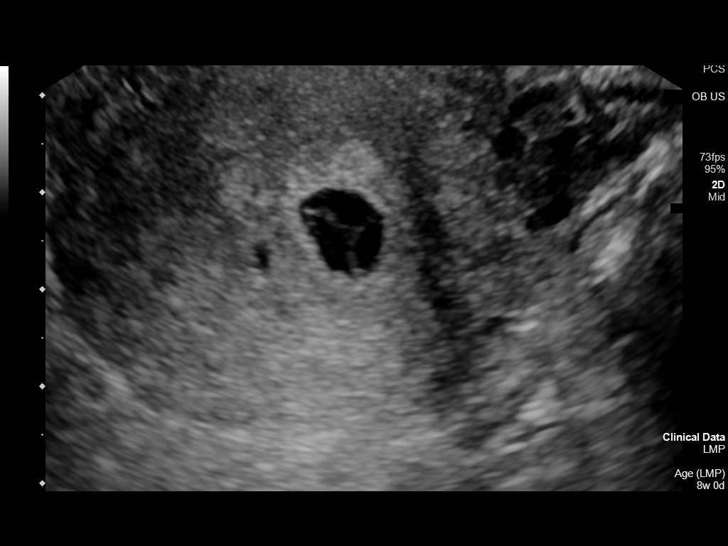
[im 14/71]
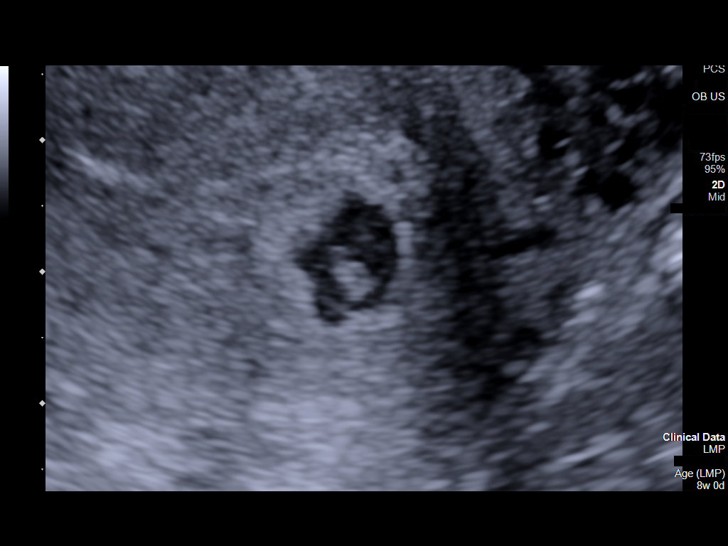
[im 19/71]
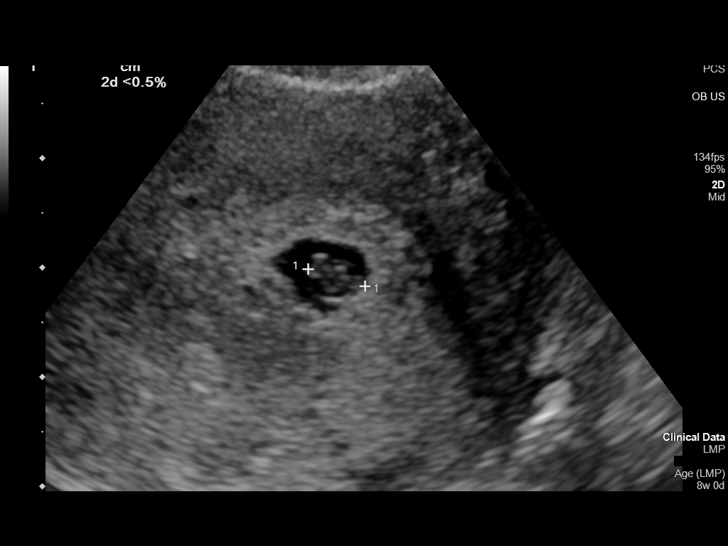
[im 25/71]
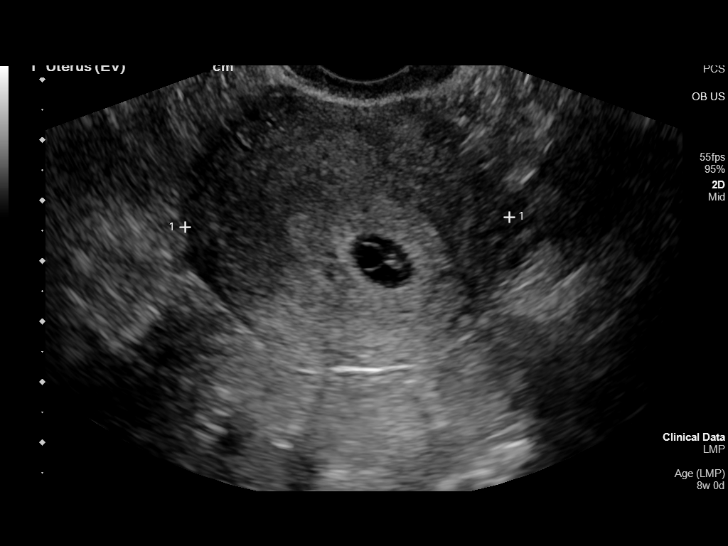
[im 30/71]
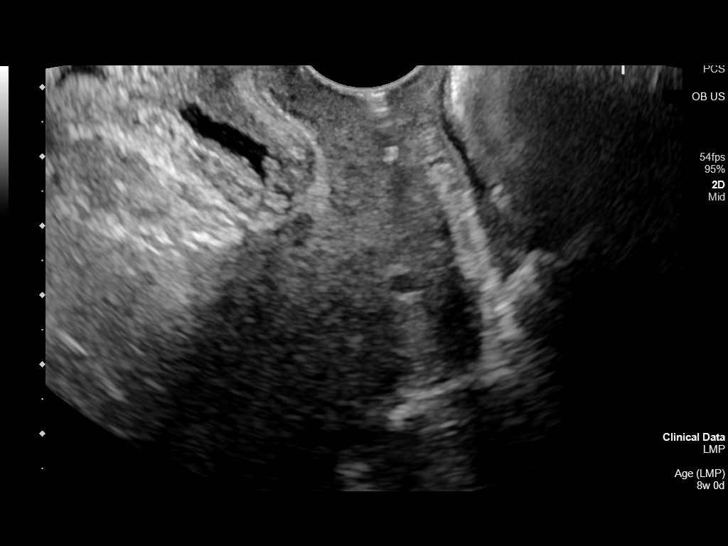
[im 38/71]
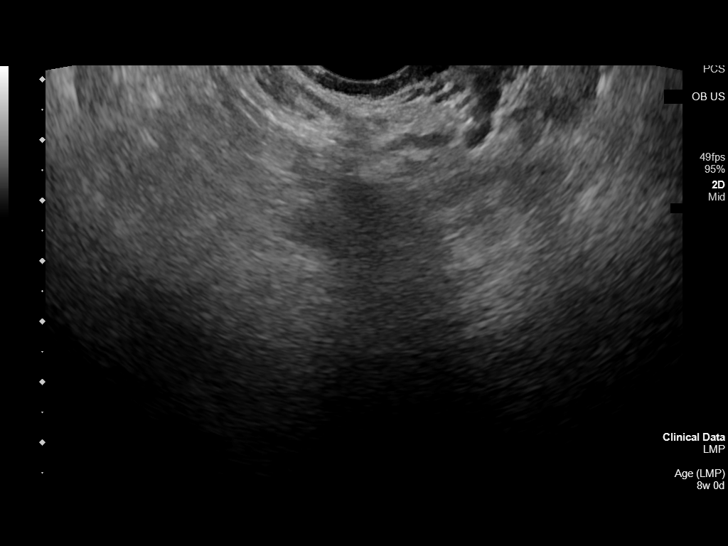
[im 44/71]
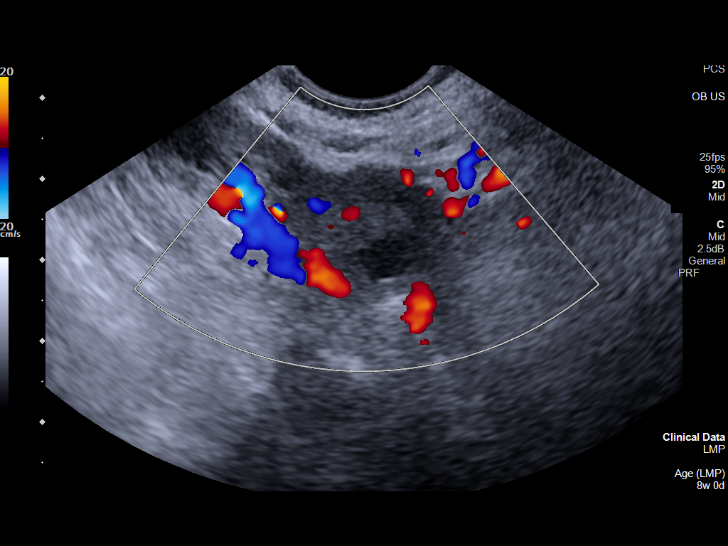
[im 49/71]
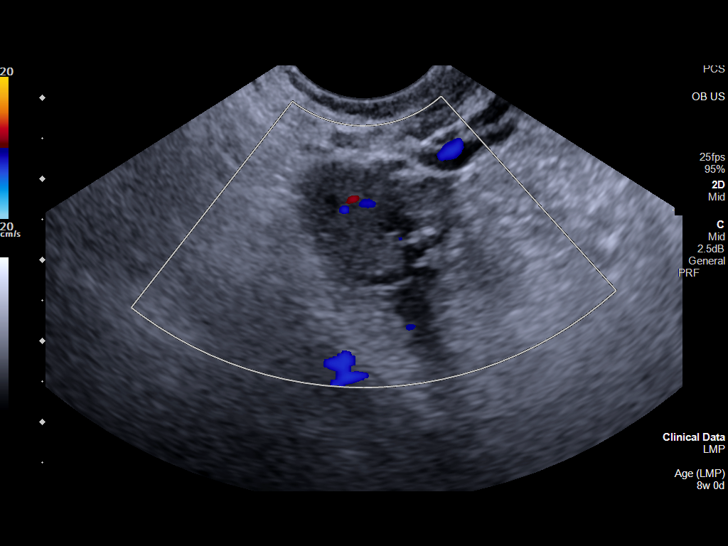
[im 54/71]
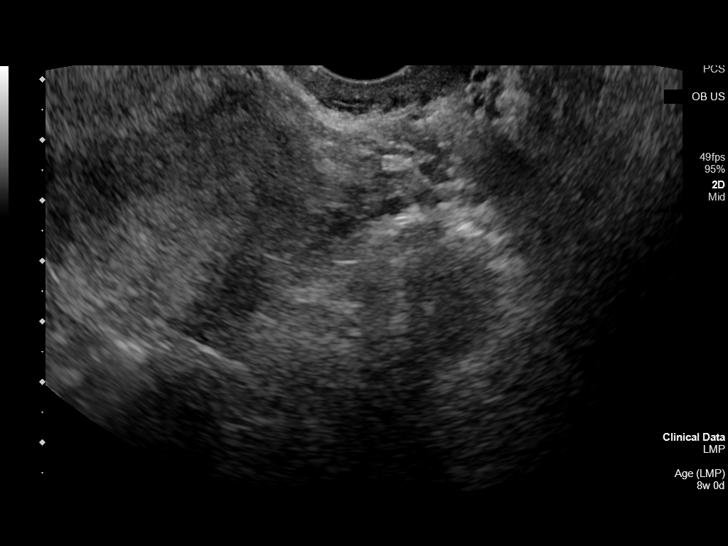
[im 60/71]
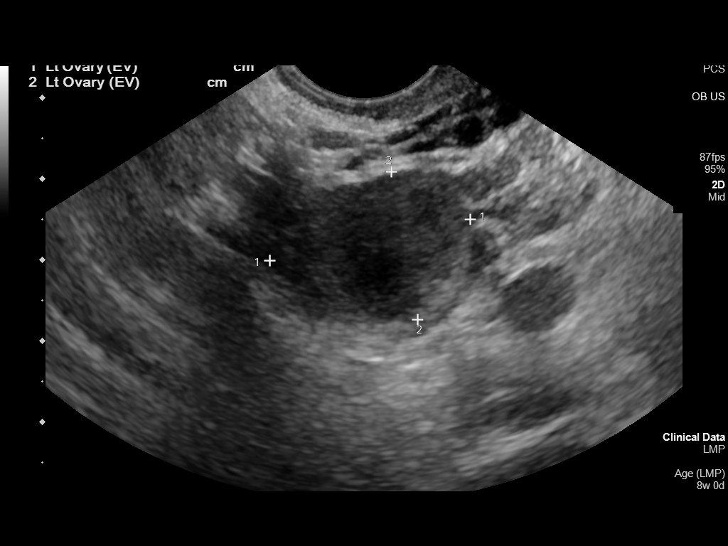
[im 65/71]
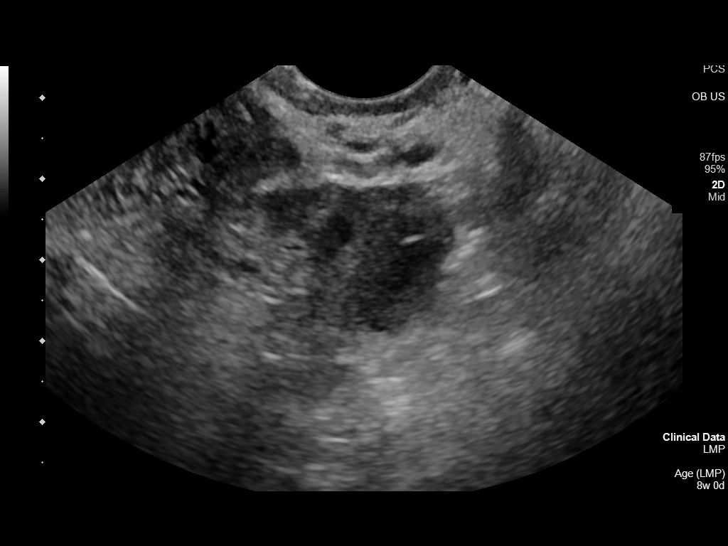
[im 71/71]
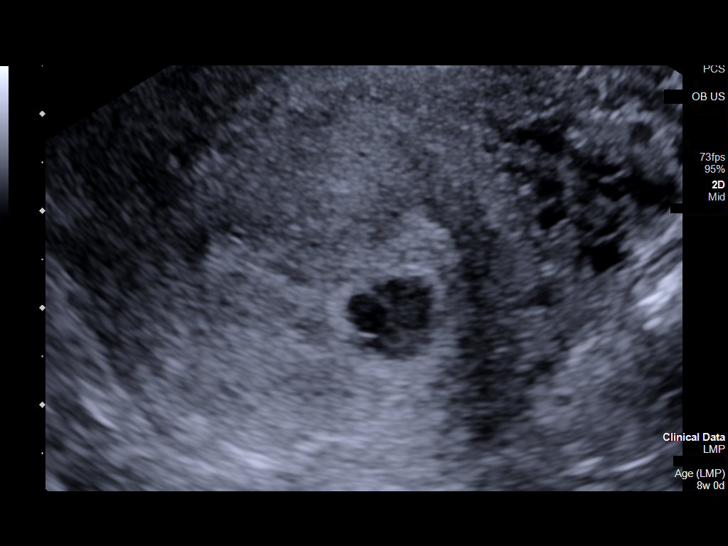

[13 of 28 positions shown; findings below may reference images not displayed]

FINDINGS: Intrauterine gestational sac: Single

Yolk sac:  Visualized.

Embryo:  Visualized.

Cardiac Activity: Not Visualized.

CRL:   4.8 mm   6 w 1 d                  US EDC: 09/05/2021

Subchorionic hemorrhage: Small subchorionic hemorrhage is seen along
the inferior aspect of the gestational sac, measuring 5 x 2 x 2 mm.

Maternal uterus/adnexae: Uterus is otherwise unremarkable. There are
no adnexal masses. Right ovary measures 2.6 x 1.5 x 1.4 cm and the
left ovary measures 1.8 x 2.5 x 1.9 cm. No free fluid.
IMPRESSION: 1. Intrauterine gestational sac, yolk sac, and fetal pole are
identified, without documented cardiac activity. Findings are
suspicious but not yet definitive for failed pregnancy. Recommend
follow-up US in 10-14 days for definitive diagnosis. This
recommendation follows SRU consensus guidelines: Diagnostic Criteria
for Nonviable Pregnancy Early in the First Trimester. N Engl J Med
2. Small subchorionic hemorrhage.
